# Patient Record
Sex: Female | Born: 1965 | Hispanic: No | Marital: Single | Smoking: Never smoker
Health system: Southern US, Community
[De-identification: ages and names within clinical notes are randomized; demographics above are authoritative.]

## PROBLEM LIST (undated history)

## (undated) DIAGNOSIS — Z9109 Other allergy status, other than to drugs and biological substances: Secondary | ICD-10-CM

## (undated) DIAGNOSIS — D259 Leiomyoma of uterus, unspecified: Secondary | ICD-10-CM

## (undated) DIAGNOSIS — D219 Benign neoplasm of connective and other soft tissue, unspecified: Secondary | ICD-10-CM

## (undated) DIAGNOSIS — M8580 Other specified disorders of bone density and structure, unspecified site: Secondary | ICD-10-CM

## (undated) DIAGNOSIS — E785 Hyperlipidemia, unspecified: Secondary | ICD-10-CM

## (undated) DIAGNOSIS — T7840XA Allergy, unspecified, initial encounter: Secondary | ICD-10-CM

## (undated) HISTORY — PX: OTHER SURGICAL HISTORY: SHX169

## (undated) HISTORY — DX: Other allergy status, other than to drugs and biological substances: Z91.09

## (undated) HISTORY — PX: MYOMECTOMY: SHX85

## (undated) HISTORY — DX: Allergy, unspecified, initial encounter: T78.40XA

## (undated) HISTORY — DX: Benign neoplasm of connective and other soft tissue, unspecified: D21.9

## (undated) HISTORY — DX: Leiomyoma of uterus, unspecified: D25.9

## (undated) HISTORY — DX: Hyperlipidemia, unspecified: E78.5

## (undated) HISTORY — PX: APPENDECTOMY: SHX54

## (undated) HISTORY — DX: Other specified disorders of bone density and structure, unspecified site: M85.80

---

## 2005-04-23 HISTORY — PX: HYSTEROSCOPY: SHX211

## 2006-07-25 ENCOUNTER — Emergency Department (HOSPITAL_COMMUNITY): Admission: EM | Admit: 2006-07-25 | Discharge: 2006-07-26 | Payer: Self-pay | Admitting: Emergency Medicine

## 2006-10-16 ENCOUNTER — Other Ambulatory Visit: Admission: RE | Admit: 2006-10-16 | Discharge: 2006-10-16 | Payer: Self-pay | Admitting: Obstetrics and Gynecology

## 2007-06-03 ENCOUNTER — Encounter: Admission: RE | Admit: 2007-06-03 | Discharge: 2007-06-03 | Payer: Self-pay | Admitting: Internal Medicine

## 2007-07-06 ENCOUNTER — Emergency Department (HOSPITAL_COMMUNITY): Admission: EM | Admit: 2007-07-06 | Discharge: 2007-07-07 | Payer: Self-pay | Admitting: Emergency Medicine

## 2008-03-10 ENCOUNTER — Encounter: Payer: Self-pay | Admitting: Obstetrics and Gynecology

## 2008-03-10 ENCOUNTER — Ambulatory Visit: Payer: Self-pay | Admitting: Obstetrics and Gynecology

## 2008-03-10 ENCOUNTER — Other Ambulatory Visit: Admission: RE | Admit: 2008-03-10 | Discharge: 2008-03-10 | Payer: Self-pay | Admitting: Obstetrics and Gynecology

## 2008-10-22 ENCOUNTER — Ambulatory Visit: Payer: Self-pay | Admitting: Obstetrics and Gynecology

## 2008-10-27 IMAGING — CT CT ABDOMEN W/ CM
3 of 5 series · 14 of 32 positions shown, 19 images · IV contrast (OMNI 300/WATER & 100 ML OMNI 300)
Comparison: none

CLINICAL DATA: Right-sided abdominal pain.
ABDOMEN CT WITH CONTRAST:
TECHNIQUE: Multidetector CT imaging of the abdomen was performed following the standard protocol during bolus administration of intravenous contrast.
Contrast:  922cc Omnipaque 300.  Oral contrast was also given.
TECHNIQUE: Multidetector CT imaging of the pelvis was performed following the standard protocol during bolus administration of intravenous contrast.

[Series 2: routine abdomen · axial · 0.66mm/px · z∈[-376,-131]mm · 4 of 83 slices shown, 9 images]
[im 17/83  soft-tissue]
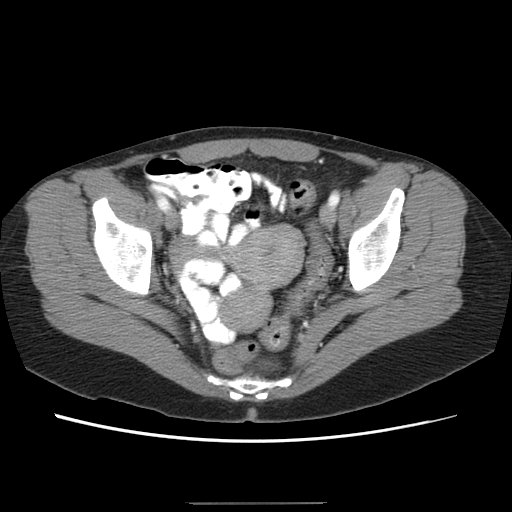
[im 17/83  lung]
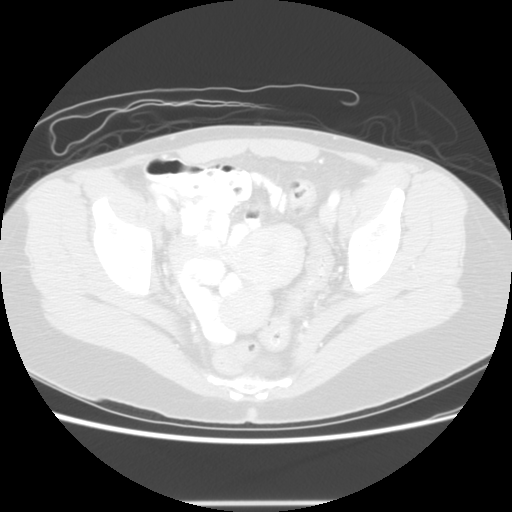
[im 17/83  bone]
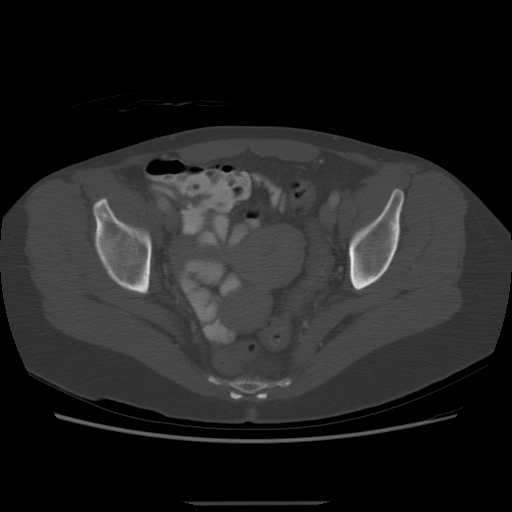
[im 33/83  soft-tissue]
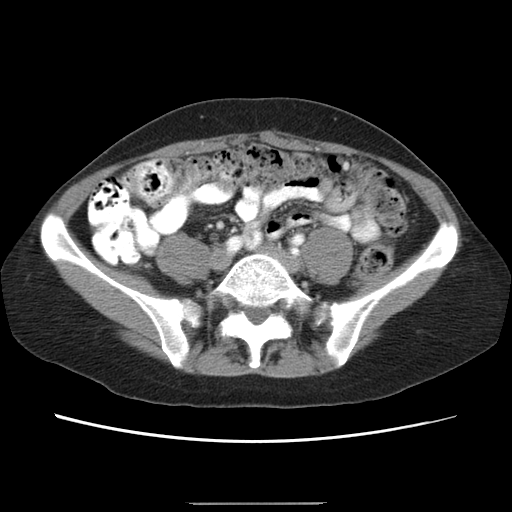
[im 33/83  lung]
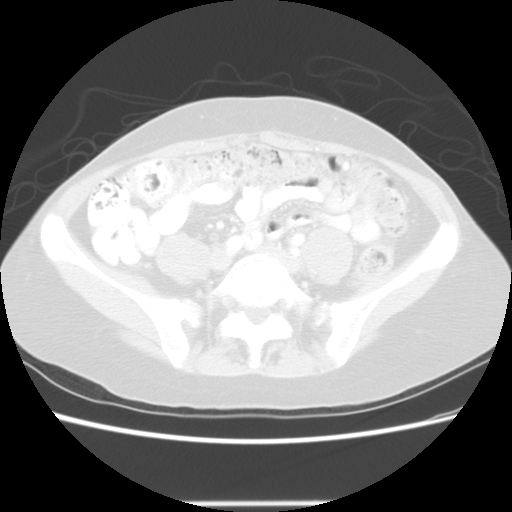
[im 50/83  soft-tissue]
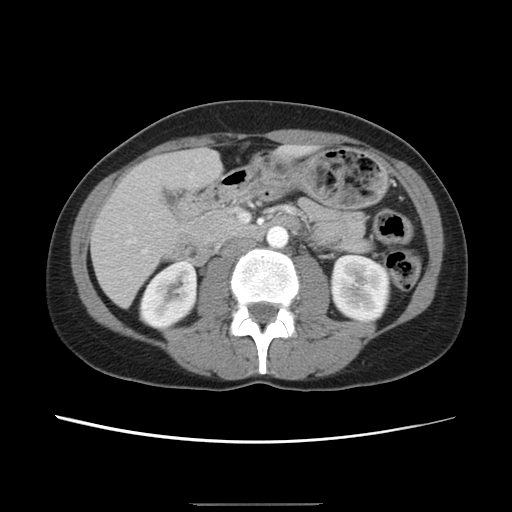
[im 50/83  lung]
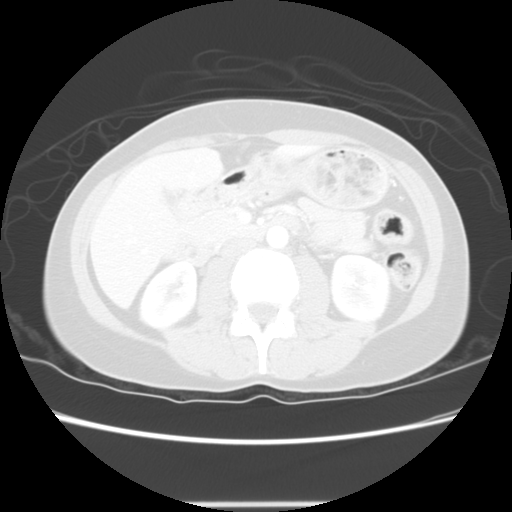
[im 66/83  soft-tissue]
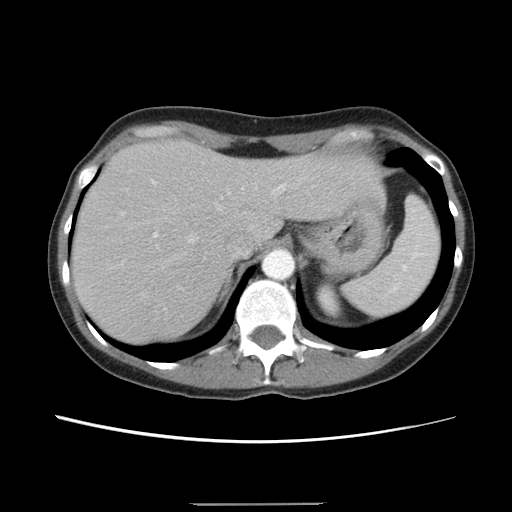
[im 66/83  lung]
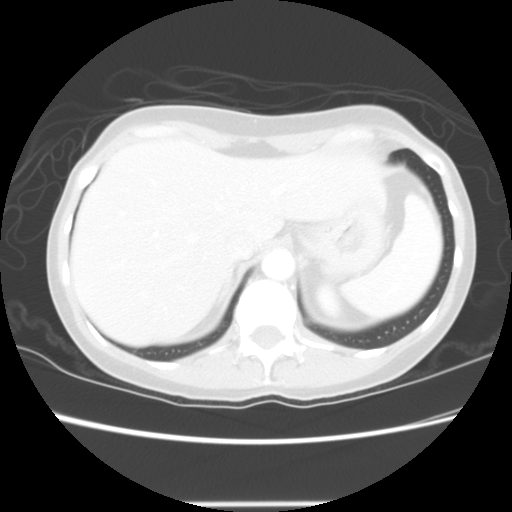

[Series 400: reformatted · sagittal · 0.87mm/px · 8 of 158 slices shown (1 of 2)]
[im 15/158  soft-tissue]
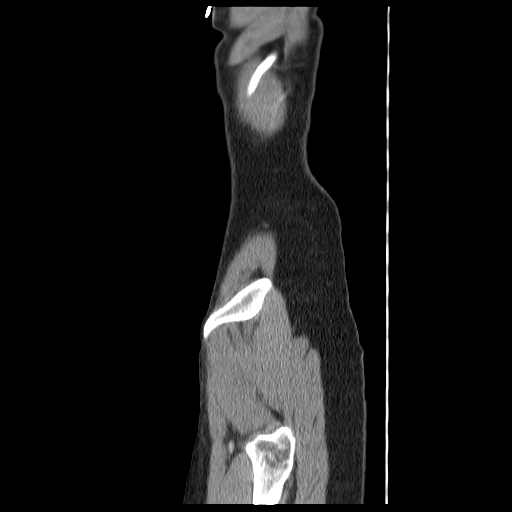
[im 29/158  soft-tissue]
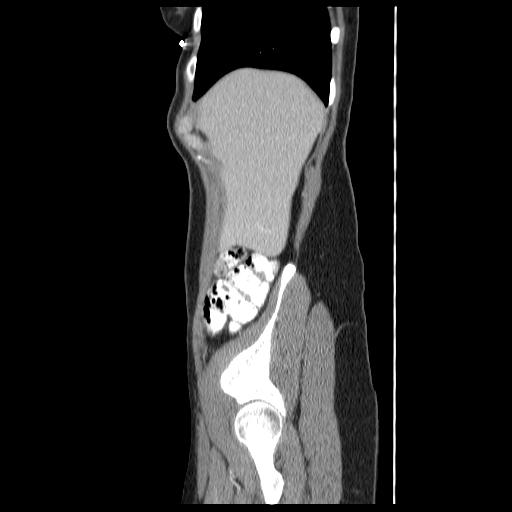
[im 58/158  soft-tissue]
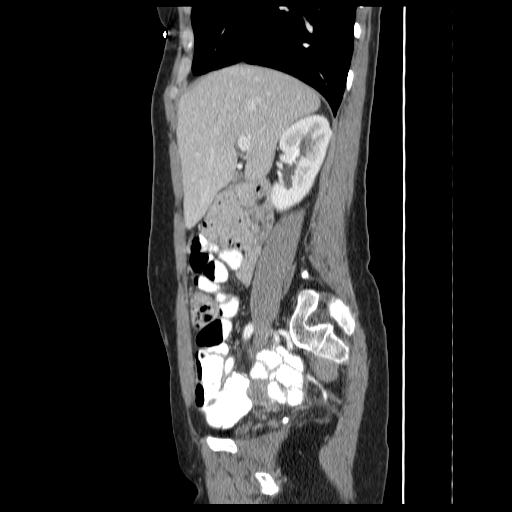
[im 72/158  soft-tissue]
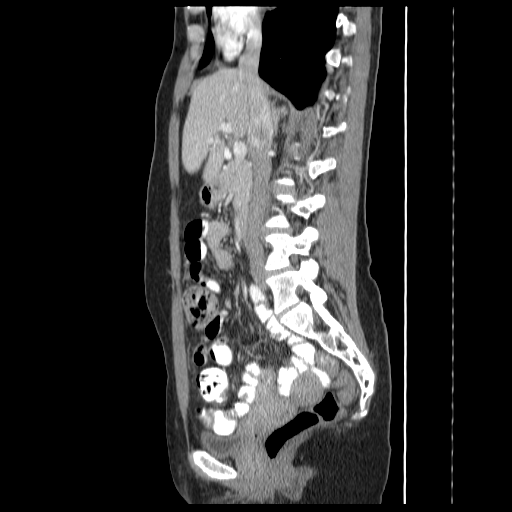
[im 86/158  soft-tissue]
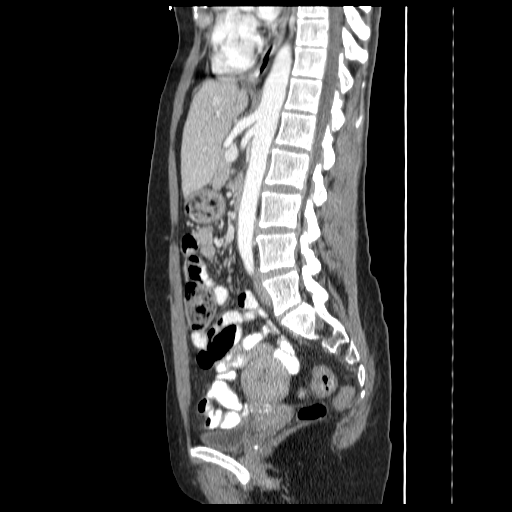
[im 100/158  soft-tissue]
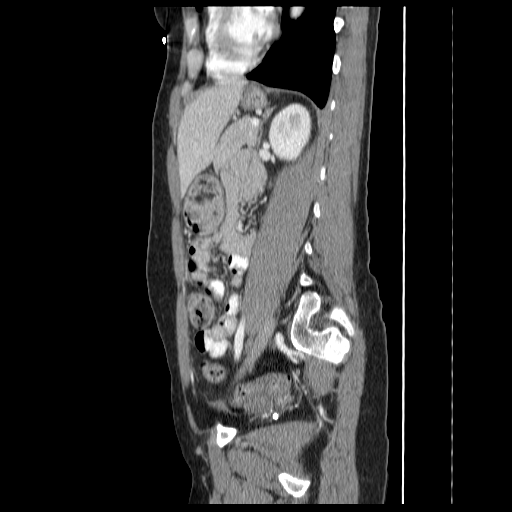
[im 129/158  soft-tissue]
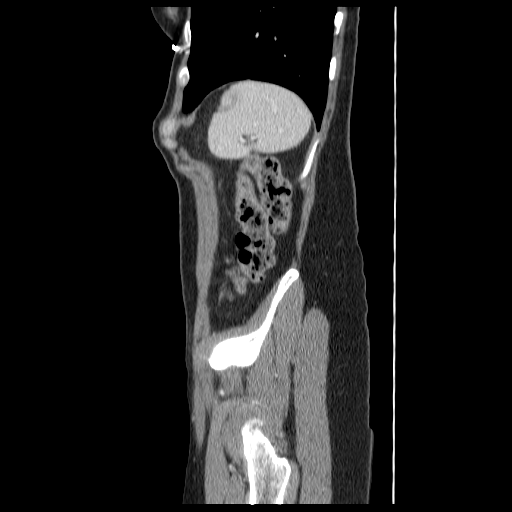
[im 143/158  soft-tissue]
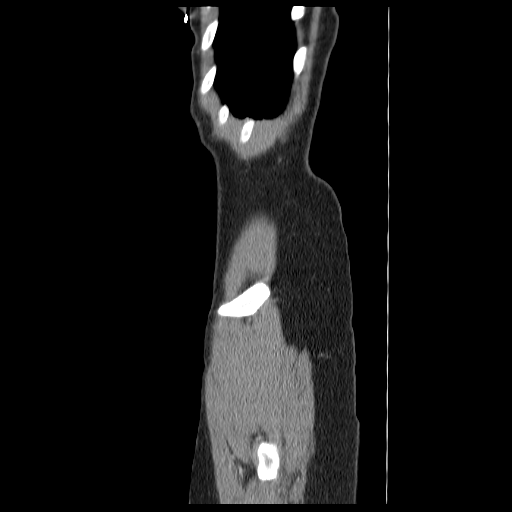

[Series 401: reformatted · coronal · 0.87mm/px · 2 of 113 slices shown (2 of 2)]
[im 15/113  soft-tissue]
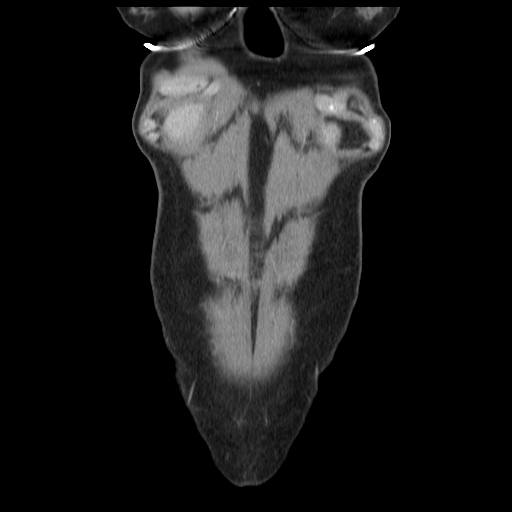
[im 29/113  soft-tissue]
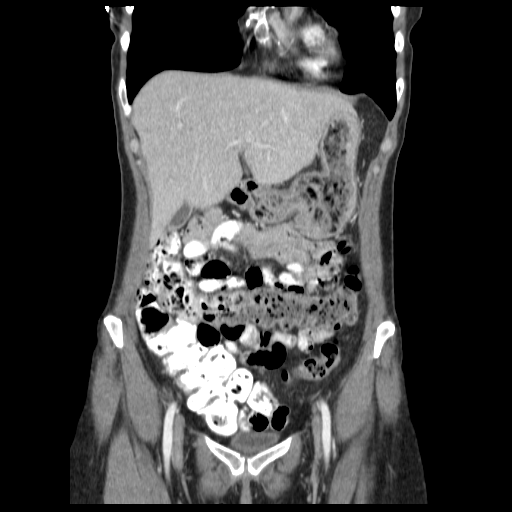

[14 of 32 positions shown; findings below may reference images not displayed]

FINDINGS: The lung bases are clear.   No pleural or pericardial fluid. 
The liver is normal in attenuation and morphology.   
The gallbladder is negative.
The spleen is negative.  
The pancreas is negative.  The adrenal glands are negative.  Left kidney and right kidney are both normal.   There is no evidence for obstructive uropathy. 
No free fluid or abnormal fluid collections.  
There are no masses within the upper abdomen.   
While the appendix cannot be identified with certainty as a separate entity, there are no secondary signs of acute appendicitis.  
Review of bone windows is unremarkable.
IMPRESSION: No acute upper abdominal CT findings.
PELVIS CT WITH CONTRAST:
FINDINGS: There is a soft tissue density mass arising from the posterior wall of the uterus measuring 3.7 cm.  This likely represents an ovarian fibroid. 
The pelvic bowel loops are otherwise unremarkable. 
No free fluid or abnormal fluid collections.  
The urinary bladder is negative.
Pelvic bowel loops are unremarkable.  
Review of bone windows is negative.
IMPRESSION: 1.  No acute pelvic CT findings. 
2.  Fibroid uterus.

## 2008-11-25 ENCOUNTER — Ambulatory Visit: Payer: Self-pay | Admitting: Obstetrics and Gynecology

## 2009-04-14 ENCOUNTER — Ambulatory Visit: Payer: Self-pay | Admitting: Obstetrics and Gynecology

## 2009-04-14 ENCOUNTER — Other Ambulatory Visit: Admission: RE | Admit: 2009-04-14 | Discharge: 2009-04-14 | Payer: Self-pay | Admitting: Obstetrics and Gynecology

## 2009-05-06 ENCOUNTER — Ambulatory Visit: Payer: Self-pay | Admitting: Obstetrics and Gynecology

## 2009-05-12 ENCOUNTER — Ambulatory Visit: Payer: Self-pay | Admitting: Obstetrics and Gynecology

## 2010-04-25 ENCOUNTER — Ambulatory Visit: Admit: 2010-04-25 | Payer: Self-pay | Admitting: Obstetrics and Gynecology

## 2010-05-02 ENCOUNTER — Ambulatory Visit
Admission: RE | Admit: 2010-05-02 | Discharge: 2010-05-02 | Payer: Self-pay | Source: Home / Self Care | Attending: Obstetrics and Gynecology | Admitting: Obstetrics and Gynecology

## 2010-05-02 ENCOUNTER — Other Ambulatory Visit
Admission: RE | Admit: 2010-05-02 | Discharge: 2010-05-02 | Payer: Self-pay | Source: Home / Self Care | Admitting: Obstetrics and Gynecology

## 2010-05-14 ENCOUNTER — Encounter: Payer: Self-pay | Admitting: Internal Medicine

## 2010-11-29 ENCOUNTER — Emergency Department (HOSPITAL_BASED_OUTPATIENT_CLINIC_OR_DEPARTMENT_OTHER)
Admission: EM | Admit: 2010-11-29 | Discharge: 2010-11-29 | Disposition: A | Discharge status: Home / Self Care | Payer: PRIVATE HEALTH INSURANCE | Attending: Emergency Medicine | Admitting: Emergency Medicine

## 2010-11-29 ENCOUNTER — Other Ambulatory Visit: Payer: Self-pay

## 2010-11-29 ENCOUNTER — Emergency Department (INDEPENDENT_AMBULATORY_CARE_PROVIDER_SITE_OTHER): Payer: PRIVATE HEALTH INSURANCE

## 2010-11-29 DIAGNOSIS — R079 Chest pain, unspecified: Secondary | ICD-10-CM

## 2010-11-29 DIAGNOSIS — R799 Abnormal finding of blood chemistry, unspecified: Secondary | ICD-10-CM

## 2010-11-29 DIAGNOSIS — R0602 Shortness of breath: Secondary | ICD-10-CM | POA: Insufficient documentation

## 2010-11-29 LAB — BASIC METABOLIC PANEL
Chloride: 99 mEq/L (ref 96–112)
GFR calc Af Amer: >60 mL/min (ref >60)
GFR calc non Af Amer: >60 mL/min (ref >60)
Potassium: 4.2 mEq/L (ref 3.5–5.1)
Sodium: 137 mEq/L (ref 135–145)

## 2010-11-29 LAB — CARDIAC PANEL(CRET KIN+CKTOT+MB+TROPI)
Relative Index: INVALID (ref 0.0–2.5)
Total CK: 41 U/L (ref 7–177)
Troponin I: <0.3 ng/mL (ref <0.30)

## 2010-11-29 LAB — CBC
MCHC: 34.3 g/dL (ref 30.0–36.0)
Platelets: 344 10*3/uL (ref 150–400)
RDW: 12.6 % (ref 11.5–15.5)
WBC: 7.1 10*3/uL (ref 4.0–10.5)

## 2010-11-29 MED ORDER — IBUPROFEN 800 MG PO TABS: 800.0000 mg | ORAL_TABLET | Freq: Three times a day (TID) | ORAL | Status: AC | PRN

## 2010-11-29 MED ORDER — MORPHINE SULFATE 2 MG/ML IJ SOLN
2.0000 mg | Freq: Once | INTRAMUSCULAR | Status: AC
  Administered 2010-11-29: 2 mg via INTRAVENOUS
  Filled 2010-11-29: qty 1

## 2010-11-29 MED ORDER — ASPIRIN 325 MG PO TABS
325.0000 mg | ORAL_TABLET | ORAL | Status: AC
  Administered 2010-11-29: 325 mg via ORAL
  Filled 2010-11-29: qty 1

## 2010-11-29 MED ORDER — IOHEXOL 350 MG/ML SOLN
80.0000 mL | Freq: Once | INTRAVENOUS | Status: AC | PRN
  Administered 2010-11-29: 80 mL via INTRAVENOUS

## 2010-11-29 MED ORDER — IBUPROFEN 800 MG PO TABS
800.0000 mg | ORAL_TABLET | Freq: Once | ORAL | Status: AC
  Administered 2010-11-29: 800 mg via ORAL
  Filled 2010-11-29: qty 1

## 2010-11-29 NOTE — ED Notes (Signed)
Pt returned from radiology.

## 2010-11-29 NOTE — ED Notes (Signed)
Pt returned from radiology.  New order received for CT scan.  Pt and family informed of plan of care.  Urine collected as ordered.

## 2010-11-29 NOTE — ED Notes (Signed)
Pt ambulatory to BR.  Awaiting results.

## 2010-11-29 NOTE — ED Notes (Signed)
Pt transported to radiology.

## 2010-11-29 NOTE — ED Provider Notes (Signed)
History     CSN: [id number] Arrival date & time: 11/29/2010  8:12 AM  Chief Complaint  Patient presents with  . Chest Pain   Patient is a 45 y.o. female presenting with chest pain.  Chest Pain The chest pain began 3 - 5 hours ago. Duration of episode(s) is 4 hours. Chest pain occurs constantly. The chest pain is improving. The pain is associated with breathing, coughing, exertion and lifting. At its most intense, the pain is at 8/10. The pain is currently at 2/10. The quality of the pain is described as aching and sharp. The pain radiates to the left shoulder. Chest pain is worsened by certain positions. She tried NSAIDs for the symptoms.  Her family medical history is significant for CAD in family.     No past medical history on file.  Past Surgical History  Procedure Date  . Appendectomy     No family history on file.  History  Substance Use Topics  . Smoking status: Never Smoker   . Smokeless tobacco: Not on file  . Alcohol Use: Yes     occasional    OB History    Grav Para Term Preterm Abortions TAB SAB Ect Mult Living                  Review of Systems  Constitutional: Negative.   HENT: Negative.   Eyes: Negative.   Respiratory: Negative.   Cardiovascular: Positive for chest pain.  Gastrointestinal: Negative.   Genitourinary: Negative.   Musculoskeletal: Negative.   Neurological: Negative.   Hematological: Negative.   Psychiatric/Behavioral: Negative.   All other systems reviewed and are negative.    Physical Exam  BP 94/65  Pulse 67  Temp(Src) 98.1 F (36.7 C) (Oral)  Resp 16  Ht 5\' 6"  (1.676 m)  Wt 130 lb (58.968 kg)  BMI 20.98 kg/m2  SpO2 100%  LMP 11/19/2010  Physical Exam  Nursing note and vitals reviewed. Constitutional: She is oriented to person, place, and time. She appears well-developed and well-nourished. No distress.  HENT:  Head: Normocephalic and atraumatic.  Eyes: Conjunctivae are normal. Pupils are equal, round, and reactive  to light.  Neck: Normal range of motion.  Cardiovascular: Normal rate, regular rhythm, normal heart sounds and intact distal pulses.  Exam reveals no gallop and no friction rub.   No murmur heard. Pulmonary/Chest: Effort normal and breath sounds normal. No respiratory distress. She has no wheezes. She has no rales.  Abdominal: Soft. Bowel sounds are normal. She exhibits no distension. There is no tenderness. There is no rebound and no guarding.  Musculoskeletal: Normal range of motion. She exhibits no edema and no tenderness.  Neurological: She is alert and oriented to person, place, and time. No cranial nerve deficit. She exhibits normal muscle tone. Coordination normal.  Skin: Skin is warm and dry.  Psychiatric: Her mood appears anxious.    ED Course  Procedures  Date: 11/29/2010  Rate: 80  Rhythm: normal sinus rhythm  QRS Axis: normal  Intervals: normal  ST/T Wave abnormalities: normal  Conduction Disutrbances:right bundle branch block  Narrative Interpretation:   Old EKG Reviewed: none available  MDM Patient was examined by myself with her parents at the bedside.  Patient was quite anxious though she had no major risk factors for CAD or PE.  Lab work-up was unremarkable aside from elevated TNI.  ECG showed RBBB and CXR was clear with possible artifact from nipple shadow.  Patient has CT angio with no PE  and no findings related to finding noted on CXR.  Patient was treated for her pain and had improvement.  She was told she could follow-up with her PCP. Patient was discharged home in good condition and family was comfortable with the plan.  A: 45 yo F who presents with chest pain.  P: As per plan in MDM      Cyndra Numbers, MD 12/05/10 1547

## 2010-11-29 NOTE — ED Notes (Signed)
Family at bedside. 

## 2010-11-29 NOTE — ED Notes (Signed)
MD at bedside. 

## 2010-11-29 NOTE — ED Notes (Signed)
Pt medicated as ordered, labs collected and pt informed of plan of care.  VSS.  NSR per cardiac monitor.

## 2010-11-29 NOTE — ED Notes (Signed)
Pt reports onset of left chest wall pain radiating to left arm and shoulder upon awakening at 0700 this morning.  Pain is described as dull pressure and a 6/10 at present time.  Pain increases with movement 8/10.  States pain is an "ache" at present.

## 2011-01-15 LAB — DIFFERENTIAL
Basophils Absolute: 0
Basophils Relative: 1
Eosinophils Absolute: 0.3
Eosinophils Relative: 4
Lymphs Abs: 2.2
Neutrophils Relative %: 62

## 2011-01-15 LAB — URINALYSIS, ROUTINE W REFLEX MICROSCOPIC
Bilirubin Urine: NEGATIVE
Hgb urine dipstick: NEGATIVE
Ketones, ur: NEGATIVE
Nitrite: NEGATIVE
Protein, ur: NEGATIVE
Urobilinogen, UA: 0.2

## 2011-01-15 LAB — COMPREHENSIVE METABOLIC PANEL
ALT: 21
AST: 19
CO2: 26
Calcium: 8.7
Chloride: 97
Creatinine, Ser: 0.68
GFR calc Af Amer: >60
GFR calc non Af Amer: >60
Glucose, Bld: 101 — ABNORMAL HIGH
Sodium: 130 — ABNORMAL LOW
Total Bilirubin: 0.5

## 2011-01-15 LAB — CBC
Hemoglobin: 13.4
MCHC: 34.1
MCV: 94.7
RBC: 4.14
WBC: 8.8

## 2011-01-15 LAB — POCT PREGNANCY, URINE: Operator id: 272551

## 2011-01-15 LAB — SEDIMENTATION RATE: Sed Rate: 20

## 2011-01-15 LAB — LIPASE, BLOOD: Lipase: 43

## 2011-05-01 ENCOUNTER — Encounter: Payer: Self-pay | Admitting: Obstetrics and Gynecology

## 2011-05-04 ENCOUNTER — Encounter: Payer: Self-pay | Admitting: Gynecology

## 2011-05-11 ENCOUNTER — Other Ambulatory Visit (HOSPITAL_COMMUNITY)
Admission: RE | Admit: 2011-05-11 | Discharge: 2011-05-11 | Disposition: A | Discharge status: Home / Self Care | Payer: PRIVATE HEALTH INSURANCE | Source: Ambulatory Visit | Attending: Obstetrics and Gynecology | Admitting: Obstetrics and Gynecology

## 2011-05-11 ENCOUNTER — Ambulatory Visit (INDEPENDENT_AMBULATORY_CARE_PROVIDER_SITE_OTHER): Payer: PRIVATE HEALTH INSURANCE | Admitting: Obstetrics and Gynecology

## 2011-05-11 ENCOUNTER — Encounter: Payer: Self-pay | Admitting: Obstetrics and Gynecology

## 2011-05-11 VITALS — BP 120/64 | Ht 65.0 in | Wt 137.0 lb

## 2011-05-11 DIAGNOSIS — R609 Edema, unspecified: Secondary | ICD-10-CM

## 2011-05-11 DIAGNOSIS — Z01419 Encounter for gynecological examination (general) (routine) without abnormal findings: Secondary | ICD-10-CM | POA: Insufficient documentation

## 2011-05-11 DIAGNOSIS — N899 Noninflammatory disorder of vagina, unspecified: Secondary | ICD-10-CM

## 2011-05-11 DIAGNOSIS — D259 Leiomyoma of uterus, unspecified: Secondary | ICD-10-CM | POA: Insufficient documentation

## 2011-05-11 DIAGNOSIS — Z9109 Other allergy status, other than to drugs and biological substances: Secondary | ICD-10-CM | POA: Insufficient documentation

## 2011-05-11 DIAGNOSIS — N898 Other specified noninflammatory disorders of vagina: Secondary | ICD-10-CM

## 2011-05-11 DIAGNOSIS — D219 Benign neoplasm of connective and other soft tissue, unspecified: Secondary | ICD-10-CM | POA: Insufficient documentation

## 2011-05-11 DIAGNOSIS — E8779 Other fluid overload: Secondary | ICD-10-CM

## 2011-05-11 LAB — LIPID PANEL
Cholesterol: 248 mg/dL — ABNORMAL HIGH (ref 0–200)
Total CHOL/HDL Ratio: 4.7 Ratio
Triglycerides: 246 mg/dL — ABNORMAL HIGH (ref <150)

## 2011-05-11 LAB — URINALYSIS W MICROSCOPIC + REFLEX CULTURE
Bilirubin Urine: NEGATIVE
Glucose, UA: NEGATIVE mg/dL
Hgb urine dipstick: NEGATIVE
Ketones, ur: NEGATIVE mg/dL
Nitrite: NEGATIVE
Specific Gravity, Urine: 1.02 (ref 1.005–1.030)
pH: 6 (ref 5.0–8.0)

## 2011-05-11 LAB — BASIC METABOLIC PANEL
BUN: 12 mg/dL (ref 6–23)
Chloride: 99 mEq/L (ref 96–112)
Creat: 0.77 mg/dL (ref 0.50–1.10)
Glucose, Bld: 77 mg/dL (ref 70–99)
Potassium: 4.4 mEq/L (ref 3.5–5.3)

## 2011-05-11 MED ORDER — TERCONAZOLE 0.8 % VA CREA: 1.0000 | TOPICAL_CREAM | Freq: Every day | VAGINAL | Status: DC

## 2011-05-11 NOTE — Progress Notes (Signed)
Patient came to see me today for her annual GYN exam. She remains on birth control pills. She says she is having 2 episodes a month of PMS type symptoms with abdominal bloating, etc. The first is after a week of the pills and the second is in the third week of the pills. She is up-to-date on mammograms. She is only having one period a month. She also feels like she did when she had a submucous myoma in terms of feeling pregnant. She was told them by the gynecologist that her fibroid was the size size with three-month pregnancy. It was removed hysteroscopically. She now wonders if this recurred. She is also been on antibiotic and now has vaginal irritation and thinks she has a yeast infection. She is having no pelvic pain. She takes spironolactone for acne and her dermatologist wanted her electrolytes checked.  Physical examination: Kennon Portela present. HEENT within normal limits. Neck: Thyroid not large. No masses. Supraclavicular nodes: not enlarged. Breasts: Examined in both sitting midline position. No skin changes and no masses. Abdomen: Soft no guarding rebound or masses or hernia. Pelvic: External: Within normal limits. BUS: Within normal limits. Vaginal:within normal limits except discharge that looks like a yeast infection. Wet prep was negative. Good estrogen effect. No evidence of cystocele rectocele or enterocele. Cervix: clean. Uterus: Normal size and shape. Adnexa: No masses. Rectovaginal exam: Confirmatory and negative. Extremities: Within normal limits.  Assessment: #1. PMS on birth control pills #2. Yeast vaginitis  Plan: Switched her birth control pills to lo Loestrin. She will let me know if her symptoms improve. 2 sample packs given. I told her I didn't think her symptoms of weight gain in her lower abdomen and bloating were related to a fibroid tumor due to a normal pelvic exam today. Explained that the only way to know if it reoccurred( submucous fibroid) was to do an SIH. We will  wait to do this.  Bmet obtained. Patient treated with terconazole for yeast vaginitis.

## 2011-05-12 LAB — CBC WITH DIFFERENTIAL/PLATELET
Basophils Relative: 1 % (ref 0–1)
Eosinophils Absolute: 0.3 10*3/uL (ref 0.0–0.7)
Lymphs Abs: 1.5 10*3/uL (ref 0.7–4.0)
MCH: 31.5 pg (ref 26.0–34.0)
Neutro Abs: 3.7 10*3/uL (ref 1.7–7.7)
Neutrophils Relative %: 64 % (ref 43–77)
Platelets: 426 10*3/uL — ABNORMAL HIGH (ref 150–400)
RBC: 4.47 MIL/uL (ref 3.87–5.11)

## 2011-05-14 LAB — WET PREP FOR TRICH, YEAST, CLUE
Clue Cells Wet Prep HPF POC: NONE SEEN
Trich, Wet Prep: NONE SEEN
WBC, Wet Prep HPF POC: NONE SEEN

## 2011-05-15 ENCOUNTER — Other Ambulatory Visit: Payer: Self-pay | Admitting: *Deleted

## 2011-05-15 DIAGNOSIS — E7800 Pure hypercholesterolemia, unspecified: Secondary | ICD-10-CM

## 2011-06-01 ENCOUNTER — Other Ambulatory Visit: Payer: Self-pay | Admitting: Obstetrics and Gynecology

## 2011-07-03 ENCOUNTER — Telehealth: Payer: Self-pay | Admitting: *Deleted

## 2011-07-03 MED ORDER — NORETHIN ACE-ETH ESTRAD-FE 1-20 MG-MCG PO TABS: 1.0000 | ORAL_TABLET | Freq: Every day | ORAL | Status: DC

## 2011-07-03 NOTE — Telephone Encounter (Signed)
Pt called requesting rx for her lo loestin birth control pills. Samples given at last OV. rx sent to [facility]

## 2012-05-28 ENCOUNTER — Ambulatory Visit (INDEPENDENT_AMBULATORY_CARE_PROVIDER_SITE_OTHER): Payer: PRIVATE HEALTH INSURANCE | Admitting: Gynecology

## 2012-05-28 ENCOUNTER — Encounter: Payer: Self-pay | Admitting: Gynecology

## 2012-05-28 DIAGNOSIS — R140 Abdominal distension (gaseous): Secondary | ICD-10-CM

## 2012-05-28 DIAGNOSIS — Z3041 Encounter for surveillance of contraceptive pills: Secondary | ICD-10-CM

## 2012-05-28 DIAGNOSIS — R141 Gas pain: Secondary | ICD-10-CM

## 2012-05-28 DIAGNOSIS — D259 Leiomyoma of uterus, unspecified: Secondary | ICD-10-CM

## 2012-05-28 MED ORDER — NORETHIN ACE-ETH ESTRAD-FE 1-20 MG-MCG PO TABS: 1.0000 | ORAL_TABLET | Freq: Every day | ORAL | Status: DC

## 2012-05-28 NOTE — Patient Instructions (Signed)
Follow up in April for your annual exam.

## 2012-05-28 NOTE — Progress Notes (Signed)
Patient presents to discuss her birth control pills. She is a patient Dr. Eda Paschal is on Loestrin 120. She apparently was started on birth control pills following a hysteroscopic myomectomy to improve her "hormonal environment" in relationship to her history of myomas. This was through a physician in [place]. She was having some issues with her prior pill and Dr. Eda Paschal switched her to the Loestrin 120. She's overall doing well but does note some bloating on and off throughout the month. No overt GI symptoms such as diarrhea constipation nausea vomiting or urinary symptoms. She plans scheduling an appointment to see me for annual exam but due to insurance purposes would prefer just to discuss her current situation as to my recommendations as far as continuing on the birth control pills. I offered to perform an exam today but she declined and she understands that our discussion is based on generalities and not based on my exam. I reviewed with her that she is not sexually active and the issues of taking the birth control pills risk versus benefits. I reviewed the risks of oral contraceptives to include stroke heart attack DVT, possibly higher with advancing age. She is not being followed for any medical issues and does not smoke. She does feel better on the pills overall and states that she would prefer to continue him. I discussed her bloating symptoms whether this is more GI or not. I asked her to follow as far as associated symptoms when she feels bloated is she having nausea constipation or diarrhea. If so then we may refer her to gastroenterology. I reviewed with her whether being on the pills has a benefit from a suppressive effect on leiomyoma versus stimulate growth and I did not feel that either is an issue sacral comfortable with her being on the pills with her history of leiomyoma.  She asked if she could have a short-term refill on her birth control pills to cover her until her appointment and again  the issue of not examining her and disclaimer I cannot guarantee is not an underlying disease process understood.

## 2012-08-04 ENCOUNTER — Encounter: Payer: Self-pay | Admitting: Gynecology

## 2012-08-28 ENCOUNTER — Other Ambulatory Visit: Payer: Self-pay | Admitting: Gynecology

## 2012-08-28 MED ORDER — NORETHIN ACE-ETH ESTRAD-FE 1-20 MG-MCG PO TABS: 1.0000 | ORAL_TABLET | Freq: Every day | ORAL | Status: DC

## 2012-09-19 ENCOUNTER — Encounter: Payer: Self-pay | Admitting: Gynecology

## 2012-09-19 ENCOUNTER — Ambulatory Visit (INDEPENDENT_AMBULATORY_CARE_PROVIDER_SITE_OTHER): Payer: PRIVATE HEALTH INSURANCE | Admitting: Gynecology

## 2012-09-19 VITALS — BP 116/78 | Ht 65.0 in | Wt 125.0 lb

## 2012-09-19 DIAGNOSIS — D259 Leiomyoma of uterus, unspecified: Secondary | ICD-10-CM

## 2012-09-19 DIAGNOSIS — N926 Irregular menstruation, unspecified: Secondary | ICD-10-CM

## 2012-09-19 DIAGNOSIS — N898 Other specified noninflammatory disorders of vagina: Secondary | ICD-10-CM

## 2012-09-19 DIAGNOSIS — Z01419 Encounter for gynecological examination (general) (routine) without abnormal findings: Secondary | ICD-10-CM

## 2012-09-19 LAB — WET PREP FOR TRICH, YEAST, CLUE
Trich, Wet Prep: NONE SEEN
Yeast Wet Prep HPF POC: NONE SEEN

## 2012-09-19 MED ORDER — NORETHIN ACE-ETH ESTRAD-FE 1-20 MG-MCG PO TABS: 1.0000 | ORAL_TABLET | Freq: Every day | ORAL | Status: DC

## 2012-09-19 MED ORDER — METRONIDAZOLE 500 MG PO TABS: 500.0000 mg | ORAL_TABLET | Freq: Two times a day (BID) | ORAL | Status: DC

## 2012-09-19 NOTE — Progress Notes (Signed)
Erica [name] 05-Mar-1966 [id number]        47 y.o.  G0P0 for annual exam.  Several issues noted below.  Past medical history,surgical history, medications, allergies, family history and social history were all reviewed and documented in the EPIC chart. ROS:  Was performed and pertinent positives and negatives are included in the history.  Exam: Charity fundraiser Vitals:   09/19/12 1429  BP: 116/78  Height: 5\' 5"  (1.651 m)  Weight: 125 lb (56.7 kg)   General appearance  Normal Skin grossly normal Head/Neck normal with no cervical or supraclavicular adenopathy thyroid normal Lungs  clear Cardiac RR, without RMG Abdominal  soft, nontender, without masses, organomegaly or hernia Breasts  examined lying and sitting without masses, retractions, discharge or axillary adenopathy. Pelvic  Ext/BUS/vagina  normal with white discharge  Cervix  normal   Uterus  retroverted, normal size, shape and contour, midline and mobile nontender   Adnexa  Without masses or tenderness    Anus and perineum  normal   Rectovaginal  normal sphincter tone without palpated masses or tenderness.    Assessment/Plan:  47 y.o. G0P0 female for annual exam.   1. BCPs. Patient is on Loestrin 120 equivalents. Notes that she now skips menses since starting this. No other symptoms such as bleeding in between or menopausal symptoms during her pill free week. I reassured the patient this is not unusual with low dose oral contraceptives. She is not sexually active. Patient's comfortable with skipping menses and will continue with the Loestrin 120 equivalents. I again reviewed the risks of oral contraceptives to include advancing age, increased risk of stroke heart attack DVT. Patient understands and accepts and I refilled her x1 year. 2. History of leiomyoma status post submucous myomectomy. Was told that she had another small myoma. I recommended baseline ultrasound now as her uterus is difficult to palpate retroverted.  We discussed we will plan no intervention but I do want to get a baseline measurement for future reference. Patient agrees with this and we'll schedule the ultrasound. 3. Mammography 2013. Patient knows she is due now and agrees to schedule her mammogram. SBE monthly reviewed. 4. Pap smear 2013. No Pap smear done today. No history of significant abnormal Pap smears. Reviewed current screening guidelines and will plan every 3 year screening. 5. Health maintenance. Patient has an appointment to see her primary physician and will followup with them. No blood work done as it will be done through their office. Followup for ultrasound otherwise annually.    Dara Lords MD, 3:38 PM 09/19/2012

## 2012-09-19 NOTE — Patient Instructions (Addendum)
Follow up for ultrasound as scheduled 

## 2012-09-19 NOTE — Progress Notes (Signed)
Not getting period with the pill

## 2012-09-20 LAB — URINALYSIS W MICROSCOPIC + REFLEX CULTURE
Bilirubin Urine: NEGATIVE
Glucose, UA: NEGATIVE mg/dL
Hgb urine dipstick: NEGATIVE
Ketones, ur: NEGATIVE mg/dL
Protein, ur: NEGATIVE mg/dL

## 2012-09-22 ENCOUNTER — Other Ambulatory Visit: Payer: Self-pay | Admitting: Gynecology

## 2012-09-22 MED ORDER — NITROFURANTOIN MONOHYD MACRO 100 MG PO CAPS: 100.0000 mg | ORAL_CAPSULE | Freq: Two times a day (BID) | ORAL | Status: DC

## 2012-09-23 LAB — URINE CULTURE

## 2013-03-06 ENCOUNTER — Other Ambulatory Visit: Payer: PRIVATE HEALTH INSURANCE

## 2013-03-06 ENCOUNTER — Ambulatory Visit: Payer: PRIVATE HEALTH INSURANCE | Admitting: Gynecology

## 2013-03-25 DIAGNOSIS — M545 Low back pain, unspecified: Secondary | ICD-10-CM | POA: Insufficient documentation

## 2013-08-07 ENCOUNTER — Encounter: Payer: Self-pay | Admitting: Women's Health

## 2013-08-07 ENCOUNTER — Ambulatory Visit (INDEPENDENT_AMBULATORY_CARE_PROVIDER_SITE_OTHER): Payer: BC Managed Care – PPO | Admitting: Women's Health

## 2013-08-07 DIAGNOSIS — R350 Frequency of micturition: Secondary | ICD-10-CM

## 2013-08-07 DIAGNOSIS — N898 Other specified noninflammatory disorders of vagina: Secondary | ICD-10-CM

## 2013-08-07 LAB — URINALYSIS W MICROSCOPIC + REFLEX CULTURE
Bilirubin Urine: NEGATIVE
Glucose, UA: NEGATIVE mg/dL
Hgb urine dipstick: NEGATIVE
Ketones, ur: NEGATIVE mg/dL
Leukocytes, UA: NEGATIVE
Nitrite: NEGATIVE
Protein, ur: NEGATIVE mg/dL
Specific Gravity, Urine: 1.015 (ref 1.005–1.030)
Urobilinogen, UA: 0.2 mg/dL (ref 0.0–1.0)
pH: 6.5 (ref 5.0–8.0)

## 2013-08-07 NOTE — Patient Instructions (Signed)
Folliculitis  Folliculitis is redness, soreness, and swelling (inflammation) of the hair follicles. This condition can occur anywhere on the body. People with weakened immune systems, diabetes, or obesity have a greater risk of getting folliculitis. CAUSES  Bacterial infection. This is the most common cause.  Fungal infection.  Viral infection.  Contact with certain chemicals, especially oils and tars. Long-term folliculitis can result from bacteria that live in the nostrils. The bacteria may trigger multiple outbreaks of folliculitis over time. SYMPTOMS Folliculitis most commonly occurs on the scalp, thighs, legs, back, buttocks, and areas where hair is shaved frequently. An early sign of folliculitis is a small, white or yellow, pus-filled, itchy lesion (pustule). These lesions appear on a red, inflamed follicle. They are usually less than 0.2 inches (5 mm) wide. When there is an infection of the follicle that goes deeper, it becomes a boil or furuncle. A group of closely packed boils creates a larger lesion (carbuncle). Carbuncles tend to occur in hairy, sweaty areas of the body. DIAGNOSIS  Your caregiver can usually tell what is wrong by doing a physical exam. A sample may be taken from one of the lesions and tested in a lab. This can help determine what is causing your folliculitis. TREATMENT  Treatment may include:  Applying warm compresses to the affected areas.  Taking antibiotic medicines orally or applying them to the skin.  Draining the lesions if they contain a large amount of pus or fluid.  Laser hair removal for cases of long-lasting folliculitis. This helps to prevent regrowth of the hair. HOME CARE INSTRUCTIONS  Apply warm compresses to the affected areas as directed by your caregiver.  If antibiotics are prescribed, take them as directed. Finish them even if you start to feel better.  You may take over-the-counter medicines to relieve itching.  Do not shave  irritated skin.  Follow up with your caregiver as directed. SEEK IMMEDIATE MEDICAL CARE IF:   You have increasing redness, swelling, or pain in the affected area.  You have a fever. MAKE SURE YOU:  Understand these instructions.  Will watch your condition.  Will get help right away if you are not doing well or get worse. Document Released: 06/18/2001 Document Revised: 10/09/2011 Document Reviewed: 07/10/2011 Exit[employer] Patient Information 2014 [employer].  

## 2013-08-07 NOTE — Progress Notes (Signed)
Patient ID: Erica [name], female   DOB: [birth year], 48 y.o.   MRN: [id number] Presents with complaint of painful bump on perineum for one week. Has had this in the past. Denies urinary frequency, urgency or burning, fever, itch, discharge. Monthly cycle on Loestrin for fibroids. Not sexually active.   Exam: Appears well, external genitalia 1 cm infected hair follicle R side perineum. With slight pressure, exuded small amount white drainage.   Folliculitis  Plan: Reassured, instructed to soak in warm bath then apply triple antibiotic ointment every day. Discussed techniques for prevention of folliculitis. Will call if not resolved in one week.

## 2013-09-15 ENCOUNTER — Encounter: Payer: Self-pay | Admitting: Gynecology

## 2013-09-21 ENCOUNTER — Other Ambulatory Visit: Payer: Self-pay | Admitting: Emergency Medicine

## 2013-09-21 ENCOUNTER — Other Ambulatory Visit: Payer: Self-pay | Admitting: Gynecology

## 2013-09-21 MED ORDER — LEVOCETIRIZINE DIHYDROCHLORIDE 5 MG PO TABS: 5.0000 mg | ORAL_TABLET | Freq: Every evening | ORAL | Status: DC

## 2013-09-23 ENCOUNTER — Ambulatory Visit (INDEPENDENT_AMBULATORY_CARE_PROVIDER_SITE_OTHER): Payer: BC Managed Care – PPO | Admitting: Gynecology

## 2013-09-23 ENCOUNTER — Encounter: Payer: Self-pay | Admitting: Gynecology

## 2013-09-23 VITALS — BP 112/66 | Ht 66.0 in | Wt 130.0 lb

## 2013-09-23 DIAGNOSIS — D251 Intramural leiomyoma of uterus: Secondary | ICD-10-CM

## 2013-09-23 DIAGNOSIS — Z01419 Encounter for gynecological examination (general) (routine) without abnormal findings: Secondary | ICD-10-CM

## 2013-09-23 MED ORDER — NORETHIN ACE-ETH ESTRAD-FE 1-20 MG-MCG PO TABS: ORAL_TABLET | ORAL | Status: DC

## 2013-09-23 NOTE — Progress Notes (Signed)
Imagine Nest 1966/02/22 [id number]        48 y.o.  G0P0 for annual exam.  Several issues noted below.  Past medical history,surgical history, problem list, medications, allergies, family history and social history were all reviewed and documented as reviewed in the EPIC chart.  ROS:  12 system ROS performed with pertinent positives and negatives included in the history, assessment and plan.  Included Systems: General, HEENT, Neck, Cardiovascular, Pulmonary, Gastrointestinal, Genitourinary, Musculoskeletal, Dermatologic, Endocrine, Hematological, Neurologic, Psychiatric Additional significant findings :  None   Exam: Kim assistant Filed Vitals:   09/23/13 1159  BP: 112/66  Height: 5\' 6"  (1.676 m)  Weight: 130 lb (58.968 kg)   General appearance:  Normal affect, orientation and appearance. Skin: Grossly normal HEENT: Without gross lesions.  No cervical or supraclavicular adenopathy. Thyroid normal.  Lungs:  Clear without wheezing, rales or rhonchi Cardiac: RR, without RMG Abdominal:  Soft, nontender, without masses, guarding, rebound, organomegaly or hernia Breasts:  Examined lying and sitting without masses, retractions, discharge or axillary adenopathy. Pelvic:  Ext/BUS/vagina normal  Cervix normal  Uterus anteverted, normal size, shape and contour, midline and mobile nontender   Adnexa  Without masses or tenderness    Anus and perineum  Normal   Rectovaginal  Normal sphincter tone without palpated masses or tenderness.    Assessment/Plan:  48 y.o. G0P0 female for annual exam with regular menses, oral contraceptives.   1. Oral contraceptives. Patient continues on a Loestrin 120 equivalent BCP.  Having regular light menses. Not sexually active. Initially started on pills to improve her hormonal environment. Risk benefits of continuing on the birth control pills reviewed to include the risk of thrombosis such as stroke heart attack DVT. Is not using them for contraception. Options  to stop now and keep menstrual calendar versus continuing reviewed. Patient struggling with this decision and ultimately decided to continue for another year. Is not having hot flashes during her pill free week. Refill x1 year provided. 2. Mammography 08/2013. Continue with annual mammography. SBE monthly review. 3. Pap smear 2013. No Pap smear done today. Plan repeat Pap smear next year a 3 year interval. No history of abnormal Pap smears previously. 4. Health maintenance. No blood work done as patient reports having routine blood work done at her primary physician's office. Followup one year, sooner as needed.   Note: This document was prepared with digital dictation and possible smart phrase technology. Any transcriptional errors that result from this process are unintentional.   Anastasio Auerbach MD, 12:38 PM 09/23/2013

## 2013-09-23 NOTE — Patient Instructions (Signed)
Follow up in one year, sooner as needed. 

## 2013-09-24 ENCOUNTER — Other Ambulatory Visit: Payer: Self-pay | Admitting: *Deleted

## 2013-09-24 DIAGNOSIS — R319 Hematuria, unspecified: Secondary | ICD-10-CM

## 2013-09-24 LAB — URINALYSIS W MICROSCOPIC + REFLEX CULTURE
BACTERIA UA: NONE SEEN
Bilirubin Urine: NEGATIVE
CRYSTALS: NONE SEEN
Casts: NONE SEEN
Glucose, UA: NEGATIVE mg/dL
Hgb urine dipstick: NEGATIVE
KETONES UR: NEGATIVE mg/dL
Leukocytes, UA: NEGATIVE
Nitrite: NEGATIVE
PROTEIN: NEGATIVE mg/dL
Specific Gravity, Urine: 1.012 (ref 1.005–1.030)
Squamous Epithelial / LPF: NONE SEEN
UROBILINOGEN UA: 0.2 mg/dL (ref 0.0–1.0)
pH: 6.5 (ref 5.0–8.0)

## 2013-09-26 LAB — URINE CULTURE: Colony Count: 30000

## 2013-09-28 ENCOUNTER — Other Ambulatory Visit: Payer: Self-pay | Admitting: Gynecology

## 2013-09-28 DIAGNOSIS — R3129 Other microscopic hematuria: Secondary | ICD-10-CM

## 2013-09-28 DIAGNOSIS — N390 Urinary tract infection, site not specified: Secondary | ICD-10-CM

## 2013-09-28 MED ORDER — AMPICILLIN 500 MG PO CAPS: 500.0000 mg | ORAL_CAPSULE | Freq: Four times a day (QID) | ORAL | Status: DC

## 2013-11-24 ENCOUNTER — Encounter: Payer: Self-pay | Admitting: Gynecology

## 2013-11-24 ENCOUNTER — Ambulatory Visit (INDEPENDENT_AMBULATORY_CARE_PROVIDER_SITE_OTHER): Payer: BC Managed Care – PPO | Admitting: Gynecology

## 2013-11-24 DIAGNOSIS — N898 Other specified noninflammatory disorders of vagina: Secondary | ICD-10-CM

## 2013-11-24 DIAGNOSIS — L293 Anogenital pruritus, unspecified: Secondary | ICD-10-CM

## 2013-11-24 LAB — WET PREP FOR TRICH, YEAST, CLUE
Clue Cells Wet Prep HPF POC: NONE SEEN
Trich, Wet Prep: NONE SEEN
YEAST WET PREP: NONE SEEN

## 2013-11-24 MED ORDER — FLUCONAZOLE 150 MG PO TABS: 150.0000 mg | ORAL_TABLET | Freq: Once | ORAL | Status: DC

## 2013-11-24 NOTE — Progress Notes (Signed)
Erica [name] 09/05/1965 [id number]        48 y.o.  G0P0 presents with several days of vaginal itching. No real discharge. History of recent antibiotic use for UTI. No odor or urinary symptoms such as progressive dysuria or urgency.  Past medical history,surgical history, problem list, medications, allergies, family history and social history were all reviewed and documented in the EPIC chart.  Directed ROS with pertinent positives and negatives documented in the history of present illness/assessment and plan.  Exam: Kim assistant General appearance:  Normal Abdomen soft nontender without masses guarding or rebound Pelvic external BUS vagina with scant white discharge. Cervix normal. Uterus normal size midline mobile nontender. Adnexa without masses or tenderness.  Assessment/Plan:  48 y.o. G0P0 with history and exam consistent with early yeast infection. Wet prep was negative. We'll cover with Diflucan 150 mg x1 dose. Followup if symptoms persist, worsen or recur.   Note: This document was prepared with digital dictation and possible smart phrase technology. Any transcriptional errors that result from this process are unintentional.   Anastasio Auerbach MD, 12:05 PM 11/24/2013

## 2013-11-24 NOTE — Patient Instructions (Addendum)
Take the Diflucan pill once. Followup if you're vaginal itching continues.

## 2014-01-03 DIAGNOSIS — Z91018 Allergy to other foods: Secondary | ICD-10-CM | POA: Insufficient documentation

## 2014-06-15 ENCOUNTER — Encounter: Payer: Self-pay | Admitting: Gynecology

## 2014-06-15 ENCOUNTER — Ambulatory Visit (INDEPENDENT_AMBULATORY_CARE_PROVIDER_SITE_OTHER): Payer: BLUE CROSS/BLUE SHIELD | Admitting: Gynecology

## 2014-06-15 VITALS — BP 120/76

## 2014-06-15 DIAGNOSIS — N898 Other specified noninflammatory disorders of vagina: Secondary | ICD-10-CM

## 2014-06-15 DIAGNOSIS — N951 Menopausal and female climacteric states: Secondary | ICD-10-CM

## 2014-06-15 LAB — WET PREP FOR TRICH, YEAST, CLUE
Clue Cells Wet Prep HPF POC: NONE SEEN
TRICH WET PREP: NONE SEEN

## 2014-06-15 MED ORDER — FLUCONAZOLE 200 MG PO TABS: 200.0000 mg | ORAL_TABLET | Freq: Every day | ORAL | Status: DC

## 2014-06-15 NOTE — Progress Notes (Signed)
Erica [name] December 16, 1965 [id number]        48 y.o.  G0P0 presents having stopped her birth control pills this past month. She was having  Trouble getting to the pharmacy for several days and ultimately decided just to stop them. She is not using them for contraception. Notes since then the onset of hot flushes and night sweats.  Also notes some mild vaginal irritation. She thinks though that this is been going on for months without real significant discharge or odor. No urinary symptoms such as frequency dysuria urgency. Was treated for yeast August 2015 with Diflucan 150 mg. She doesn't think that it had totally cleared up since then.  Past medical history,surgical history, problem list, medications, allergies, family history and social history were all reviewed and documented in the EPIC chart.  Directed ROS with pertinent positives and negatives documented in the history of present illness/assessment and plan.  Exam: Kim assistant General appearance:  Normal Abdomen soft nontender without masses guarding rebound Pelvic external BUS vagina with slight white discharge. Cervix normal. Uterus normal size midline mobile nontender. Adnexa without masses or tenderness.    Assessment/Plan:  49 y.o. G0P0 with:  1. Vaginal irritation and wet prep showing yeast. Will treat with Diflucan 200 mg every other day 2 doses. Follow up if symptoms persist, worsen or recur. 2. Menopausal symptoms after stopping her oral contraceptives. She would prefer to stay off of them at this time. We discussed treatment options for her symptoms to include HRT. I discussed the issues and risks of HRT to include the WHI study with increased risk of stroke, heart attack, DVT and breast cancer. Various formulations to include transdermal, oral, transvaginal all reviewed. Patient is interested in starting. I think Activella would be a good choice being that she just came off of oral contraceptives with similar formulation and did  well with them. She understands it is not birth control and she will need to use contraception if she does choose to become sexually active. Will check baseline Peralta now before prescribing and she'll follow up for these results in a day or 2.      Anastasio Auerbach MD, 4:08 PM 06/15/2014

## 2014-06-15 NOTE — Addendum Note (Signed)
Addended by: Anastasio Auerbach on: 06/15/2014 04:13 PM   Modules accepted: Orders

## 2014-06-15 NOTE — Patient Instructions (Signed)
Take the Diflucan pill every other day for 2 doses. Follow up if your symptoms persist, worsen or recur. Follow up for hormone lab test.

## 2014-06-16 ENCOUNTER — Other Ambulatory Visit: Payer: Self-pay | Admitting: Gynecology

## 2014-06-16 LAB — FOLLICLE STIMULATING HORMONE: FSH: 69 m[IU]/mL

## 2014-06-16 MED ORDER — ESTRADIOL-NORETHINDRONE ACET 1-0.5 MG PO TABS: 1.0000 | ORAL_TABLET | Freq: Every day | ORAL | Status: DC

## 2014-08-16 ENCOUNTER — Ambulatory Visit (INDEPENDENT_AMBULATORY_CARE_PROVIDER_SITE_OTHER): Payer: BLUE CROSS/BLUE SHIELD | Admitting: Physician Assistant

## 2014-08-16 ENCOUNTER — Encounter: Payer: Self-pay | Admitting: Physician Assistant

## 2014-08-16 VITALS — BP 110/68 | HR 76 | Temp 97.7°F | Resp 16 | Ht 66.0 in | Wt 138.0 lb

## 2014-08-16 DIAGNOSIS — Z0001 Encounter for general adult medical examination with abnormal findings: Secondary | ICD-10-CM

## 2014-08-16 DIAGNOSIS — Z1211 Encounter for screening for malignant neoplasm of colon: Secondary | ICD-10-CM

## 2014-08-16 DIAGNOSIS — Z79899 Other long term (current) drug therapy: Secondary | ICD-10-CM

## 2014-08-16 DIAGNOSIS — M545 Low back pain, unspecified: Secondary | ICD-10-CM

## 2014-08-16 DIAGNOSIS — D259 Leiomyoma of uterus, unspecified: Secondary | ICD-10-CM

## 2014-08-16 DIAGNOSIS — Z9109 Other allergy status, other than to drugs and biological substances: Secondary | ICD-10-CM

## 2014-08-16 DIAGNOSIS — L709 Acne, unspecified: Secondary | ICD-10-CM | POA: Insufficient documentation

## 2014-08-16 DIAGNOSIS — R6889 Other general symptoms and signs: Secondary | ICD-10-CM

## 2014-08-16 DIAGNOSIS — E559 Vitamin D deficiency, unspecified: Secondary | ICD-10-CM | POA: Insufficient documentation

## 2014-08-16 DIAGNOSIS — Z91048 Other nonmedicinal substance allergy status: Secondary | ICD-10-CM

## 2014-08-16 DIAGNOSIS — Z780 Asymptomatic menopausal state: Secondary | ICD-10-CM

## 2014-08-16 DIAGNOSIS — E785 Hyperlipidemia, unspecified: Secondary | ICD-10-CM | POA: Insufficient documentation

## 2014-08-16 LAB — CBC WITH DIFFERENTIAL/PLATELET
BASOS ABS: 0.1 10*3/uL (ref 0.0–0.1)
Basophils Relative: 1 % (ref 0–1)
EOS ABS: 0.4 10*3/uL (ref 0.0–0.7)
EOS PCT: 5 % (ref 0–5)
HCT: 40.9 % (ref 36.0–46.0)
HEMOGLOBIN: 14.1 g/dL (ref 12.0–15.0)
Lymphocytes Relative: 29 % (ref 12–46)
Lymphs Abs: 2.4 10*3/uL (ref 0.7–4.0)
MCH: 32.3 pg (ref 26.0–34.0)
MCHC: 34.5 g/dL (ref 30.0–36.0)
MCV: 93.6 fL (ref 78.0–100.0)
MPV: 9.8 fL (ref 8.6–12.4)
Monocytes Absolute: 0.6 10*3/uL (ref 0.1–1.0)
Monocytes Relative: 7 % (ref 3–12)
NEUTROS PCT: 58 % (ref 43–77)
Neutro Abs: 4.8 10*3/uL (ref 1.7–7.7)
PLATELETS: 387 10*3/uL (ref 150–400)
RBC: 4.37 MIL/uL (ref 3.87–5.11)
RDW: 13.1 % (ref 11.5–15.5)
WBC: 8.2 10*3/uL (ref 4.0–10.5)

## 2014-08-16 MED ORDER — MELOXICAM 15 MG PO TABS: ORAL_TABLET | ORAL | Status: DC

## 2014-08-16 MED ORDER — CYCLOBENZAPRINE HCL 10 MG PO TABS: 10.0000 mg | ORAL_TABLET | Freq: Three times a day (TID) | ORAL | Status: DC | PRN

## 2014-08-16 MED ORDER — LEVOCETIRIZINE DIHYDROCHLORIDE 5 MG PO TABS: 5.0000 mg | ORAL_TABLET | Freq: Every evening | ORAL | Status: DC

## 2014-08-16 NOTE — Progress Notes (Signed)
Complete Physical  Assessment and Plan: 1. Uterine leiomyoma, unspecified location - Iron and TIBC - Ferritin - Vitamin B12  2. Environmental allergies xyzal sent in  3. Hyperlipidemia Cont red yeast rice, diet - CBC with Differential/Platelet - BASIC METABOLIC PANEL WITH GFR - Hepatic function panel - TSH - Lipid panel - Insulin, fasting - Urinalysis, Routine w reflex microscopic - Microalbumin / creatinine urine ratio  4. Vitamin D deficiency - Vit D  25 hydroxy (rtn osteoporosis monitoring)  5. Acne, unspecified acne type Send labs to Dr. Ubaldo Glassing  6. Medication management - Magnesium  7. Bilateral low back pain without sciatica ? May benefit from dry needling- no red flags.  - Ambulatory referral to Physical Therapy  8. Menopause  9. Colon cancer screening May need celiac test as well.  - Ambulatory referral to Gastroenterology  10. Encounter for general adult medical examination with abnormal findings  Discussed med's effects and SE's. Screening labs and tests as requested with regular follow-up as recommended. Over 40 minutes of exam, counseling, chart review, and critical decision making was performed this visit.   HPI  49 y.o. female  presents for a complete physical. .  Her blood pressure has been controlled at home, today their BP is BP: 110/68 mmHg She does not workout due to lower back pain.  She denies chest pain, shortness of breath, dizziness.  She is not on cholesterol medication and denies myalgias. Her cholesterol is at goal. The cholesterol last visit was:  Trigs 156, LDL 144.   Patient is not on Vitamin D supplement, Vitamin D 41. She has been on BCP but she is now in menopause, she was RX HRT from her OB/GYN but she states she had too much fluid retention and is now on Amberin and black cohosh for 1 month which is helping.  She has chronic lower back pain and has seen Dr. Nelva Bush in the past. But only when 1 time, did PT which did not help.   She  is on spirolactone for her acne and follows with Dr. Ubaldo Glassing.  Following with Dr. Starling Manns for allergies, thinks that with menopause that she may have an allergy with seafood.  She also has stopped gluten that has helped her with her allergies and thinks she may have gluten allergy/celiac, will refer GI since due for colonoscopy.  Continuing lower back pain, muscular, worse with bending. Patient denies fever, hematuria, incontinence, numbness, tingling, weakness and saddle anesthesia  Current Medications:  Current Outpatient Prescriptions on File Prior to Visit  Medication Sig Dispense Refill  . Calcium Carbonate-Vitamin D (CALCIUM + D PO) Take by mouth.    . Cholecalciferol (VITAMIN D PO) Take by mouth.    . Coenzyme Q10 (CO Q 10 PO) Take by mouth.    . Cyanocobalamin (VITAMIN B-12 PO) Take by mouth.    . levocetirizine (XYZAL) 5 MG tablet Take 1 tablet (5 mg total) by mouth every evening. 90 tablet 0  . Omega-3 Fatty Acids (OMEGA-3 EPA FISH OIL PO) Take by mouth daily.    . Red Yeast Rice Extract (RED YEAST RICE PO) Take by mouth.    . SPIRONOLACTONE PO Take by mouth.     No current facility-administered medications on file prior to visit.   Health Maintenance:   Tetanus: 2008 Pneumovax: N/A Prevnar 13: N/A Flu vaccine: 2013 Zostavax: N/A LMP: 2015, [place] 58 at OB/GYN Pap: 04/2011 MGM:08/2013  DEXA: N/A Colonoscopy: DUE EGD: N/A CT AB 2009 CT chest 2012 Last Dental  Exam: Dr. Jabier Mutton, q 6 months Last Eye Exam: [facility], glasses, q 1-2 years  Patient Care Team: Unk Pinto, MD as PCP - General (Internal Medicine) Anastasio Auerbach, MD as Consulting Physician (Gynecology) Suella Broad, MD as Consulting Physician (Physical Medicine and Rehabilitation) Rolm Bookbinder, MD as Consulting Physician (Dermatology) Ephraim Hamburger, MD as Consulting Physician (Allergy and Immunology)  Allergies: No Known Allergies Medical History:  Past Medical History  Diagnosis  Date  . Fibroid   . Environmental allergies    Surgical History:  Past Surgical History  Procedure Laterality Date  . Appendectomy    . Myomectomy    . Hysteroscopy  2007    myomectomy   Family History:  Family History  Problem Relation Age of Onset  . Cancer Mother     Bladder cancer  . Heart disease Maternal Grandmother    Social History:  History  Substance Use Topics  . Smoking status: Never Smoker   . Smokeless tobacco: Not on file  . Alcohol Use: 0.5 oz/week    1 drink(s) per week     Comment: occasional   Review of Systems: Review of Systems  Constitutional: Positive for weight loss and malaise/fatigue. Negative for fever, chills and diaphoresis.  HENT: Positive for congestion. Negative for ear discharge, ear pain, hearing loss, nosebleeds, sore throat and tinnitus.   Eyes: Negative.   Respiratory: Negative.  Negative for stridor.   Cardiovascular: Negative.   Gastrointestinal: Negative.   Genitourinary: Negative.   Musculoskeletal: Positive for myalgias and back pain. Negative for joint pain, falls and neck pain.  Skin: Negative.   Neurological: Negative.  Negative for weakness and headaches.  Psychiatric/Behavioral: Negative.     Physical Exam: Estimated body mass index is 22.28 kg/(m^2) as calculated from the following:   Height as of this encounter: 5\' 6"  (1.676 m).   Weight as of this encounter: 138 lb (62.596 kg). BP 110/68 mmHg  Pulse 76  Temp(Src) 97.7 F (36.5 C)  Resp 16  Ht 5\' 6"  (1.676 m)  Wt 138 lb (62.596 kg)  BMI 22.28 kg/m2 General Appearance: Well nourished, in no apparent distress.  Eyes: PERRLA, EOMs, conjunctiva no swelling or erythema, normal fundi and vessels.  Sinuses: No Frontal/maxillary tenderness  ENT/Mouth: Ext aud canals clear, normal light reflex with TMs without erythema, bulging. Good dentition. No erythema, swelling, or exudate on post pharynx. Tonsils not swollen or erythematous. Hearing normal.  Neck: Supple, thyroid  normal. No bruits  Respiratory: Respiratory effort normal, BS equal bilaterally without rales, rhonchi, wheezing or stridor.  Cardio: RRR without murmurs, rubs or gallops. Brisk peripheral pulses without edema.  Chest: symmetric, with normal excursions and percussion.  Breasts: defer  Abdomen: Soft, nontender, no guarding, rebound, hernias, masses, or organomegaly.  Lymphatics: Non tender without lymphadenopathy.  Genitourinary: defer Musculoskeletal: Full ROM all peripheral extremities,5/5 strength, and normal gait. Patient is able to ambulate well. Gait is  Antalgic. Straight leg raising with dorsiflexion negative bilaterally for radicular symptoms. Sensory exam in the legs are normal. Knee reflexes are normal Ankle reflexes are normal Strength is normal and symmetric in arms and legs. There is not SI tenderness to palpation.  There is paraspinal muscle spasm.  There is not midline tenderness.  ROM of spine with  limited in all spheres due to pain.  Skin: Warm, dry without rashes, lesions, ecchymosis. Neuro: Cranial nerves intact, reflexes equal bilaterally. Normal muscle tone, no cerebellar symptoms. Sensation intact.  Psych: Awake and oriented X 3, normal  affect, Insight and Judgment appropriate.   EKG: defer AORTA SCAN: defer  Vicie Mutters 2:23 PM [facility]

## 2014-08-16 NOTE — Patient Instructions (Addendum)
Use a dropper or use a cap to put olive oil,mineral oil or canola oil in the effected ear- 2-3 times a week. Let it soak for 20-30 min then you can take a shower or use a baby bulb with warm water to wash out the ear wax.  Do not use Qtips  Preventive Care for Adults A healthy lifestyle and preventive care can promote health and wellness. Preventive health guidelines for women include the following key practices.  A routine yearly physical is a good way to check with your health care provider about your health and preventive screening. It is a chance to share any concerns and updates on your health and to receive a thorough exam.  Visit your dentist for a routine exam and preventive care every 6 months. Brush your teeth twice a day and floss once a day. Good oral hygiene prevents tooth decay and gum disease.  The frequency of eye exams is based on your age, health, family medical history, use of contact lenses, and other factors. Follow your health care provider's recommendations for frequency of eye exams.  Eat a healthy diet. Foods like vegetables, fruits, whole grains, low-fat dairy products, and lean protein foods contain the nutrients you need without too many calories. Decrease your intake of foods high in solid fats, added sugars, and salt. Eat the right amount of calories for you.Get information about a proper diet from your health care provider, if necessary.  Regular physical exercise is one of the most important things you can do for your health. Most adults should get at least 150 minutes of moderate-intensity exercise (any activity that increases your heart rate and causes you to sweat) each week. In addition, most adults need muscle-strengthening exercises on 2 or more days a week.  Maintain a healthy weight. The body mass index (BMI) is a screening tool to identify possible weight problems. It provides an estimate of body fat based on height and weight. Your health care provider can  find your BMI and can help you achieve or maintain a healthy weight.For adults 20 years and older:  A BMI below 18.5 is considered underweight.  A BMI of 18.5 to 24.9 is normal.  A BMI of 25 to 29.9 is considered overweight.  A BMI of 30 and above is considered obese.  Maintain normal blood lipids and cholesterol levels by exercising and minimizing your intake of saturated fat. Eat a balanced diet with plenty of fruit and vegetables. If your lipid or cholesterol levels are high, you are over 50, or you are at high risk for heart disease, you may need your cholesterol levels checked more frequently.Ongoing high lipid and cholesterol levels should be treated with medicines if diet and exercise are not working.  If you smoke, find out from your health care provider how to quit. If you do not use tobacco, do not start.  Lung cancer screening is recommended for adults aged 55-80 years who are at high risk for developing lung cancer because of a history of smoking. A yearly low-dose CT scan of the lungs is recommended for people who have at least a 30-pack-year history of smoking and are a current smoker or have quit within the past 15 years. A pack year of smoking is smoking an average of 1 pack of cigarettes a day for 1 year (for example: 1 pack a day for 30 years or 2 packs a day for 15 years). Yearly screening should continue until the smoker has stopped smoking for   at least 15 years. Yearly screening should be stopped for people who develop a health problem that would prevent them from having lung cancer treatment.  Avoid use of street drugs. Do not share needles with anyone. Ask for help if you need support or instructions about stopping the use of drugs.  High blood pressure causes heart disease and increases the risk of stroke.  Ongoing high blood pressure should be treated with medicines if weight loss and exercise do not work.  If you are 55-79 years old, ask your health care provider if  you should take aspirin to prevent strokes.  Diabetes screening involves taking a blood sample to check your fasting blood sugar level. This should be done once every 3 years, after age 45, if you are within normal weight and without risk factors for diabetes. Testing should be considered at a younger age or be carried out more frequently if you are overweight and have at least 1 risk factor for diabetes.  Breast cancer screening is essential preventive care for women. You should practice "breast self-awareness." This means understanding the normal appearance and feel of your breasts and may include breast self-examination. Any changes detected, no matter how small, should be reported to a health care provider. Women in their 20s and 30s should have a clinical breast exam (CBE) by a health care provider as part of a regular health exam every 1 to 3 years. After age 40, women should have a CBE every year. Starting at age 40, women should consider having a mammogram (breast X-ray test) every year. Women who have a family history of breast cancer should talk to their health care provider about genetic screening. Women at a high risk of breast cancer should talk to their health care providers about having an MRI and a mammogram every year.  Breast cancer gene (BRCA)-related cancer risk assessment is recommended for women who have family members with BRCA-related cancers. BRCA-related cancers include breast, ovarian, tubal, and peritoneal cancers. Having family members with these cancers may be associated with an increased risk for harmful changes (mutations) in the breast cancer genes BRCA1 and BRCA2. Results of the assessment will determine the need for genetic counseling and BRCA1 and BRCA2 testing.  Routine pelvic exams to screen for cancer are no longer recommended for nonpregnant women who are considered low risk for cancer of the pelvic organs (ovaries, uterus, and vagina) and who do not have symptoms. Ask  your health care provider if a screening pelvic exam is right for you.  If you have had past treatment for cervical cancer or a condition that could lead to cancer, you need Pap tests and screening for cancer for at least 20 years after your treatment. If Pap tests have been discontinued, your risk factors (such as having a new sexual partner) need to be reassessed to determine if screening should be resumed. Some women have medical problems that increase the chance of getting cervical cancer. In these cases, your health care provider may recommend more frequent screening and Pap tests.    Colorectal cancer can be detected and often prevented. Most routine colorectal cancer screening begins at the age of 50 years and continues through age 75 years. However, your health care provider may recommend screening at an earlier age if you have risk factors for colon cancer. On a yearly basis, your health care provider may provide home test kits to check for hidden blood in the stool. Use of a small camera at the end   end of a tube, to directly examine the colon (sigmoidoscopy or colonoscopy), can detect the earliest forms of colorectal cancer. Talk to your health care provider about this at age 106, when routine screening begins. Direct exam of the colon should be repeated every 5-10 years through age 22 years, unless early forms of pre-cancerous polyps or small growths are found.  Osteoporosis is a disease in which the bones lose minerals and strength with aging. This can result in serious bone fractures or breaks. The risk of osteoporosis can be identified using a bone density scan. Women ages 25 years and over and women at risk for fractures or osteoporosis should discuss screening with their health care providers. Ask your health care provider whether you should take a calcium supplement or vitamin D to reduce the rate of osteoporosis.  Menopause can be associated with physical symptoms and risks. Hormone  replacement therapy is available to decrease symptoms and risks. You should talk to your health care provider about whether hormone replacement therapy is right for you.  Use sunscreen. Apply sunscreen liberally and repeatedly throughout the day. You should seek shade when your shadow is shorter than you. Protect yourself by wearing long sleeves, pants, a wide-brimmed hat, and sunglasses year round, whenever you are outdoors.  Once a month, do a whole body skin exam, using a mirror to look at the skin on your back. Tell your health care provider of new moles, moles that have irregular borders, moles that are larger than a pencil eraser, or moles that have changed in shape or color.  Stay current with required vaccines (immunizations).  Influenza vaccine. All adults should be immunized every year.  Tetanus, diphtheria, and acellular pertussis (Td, Tdap) vaccine. Pregnant women should receive 1 dose of Tdap vaccine during each pregnancy. The dose should be obtained regardless of the length of time since the last dose. Immunization is preferred during the 27th-36th week of gestation. An adult who has not previously received Tdap or who does not know her vaccine status should receive 1 dose of Tdap. This initial dose should be followed by tetanus and diphtheria toxoids (Td) booster doses every 10 years. Adults with an unknown or incomplete history of completing a 3-dose immunization series with Td-containing vaccines should begin or complete a primary immunization series including a Tdap dose. Adults should receive a Td booster every 10 years.    Zoster vaccine. One dose is recommended for adults aged 72 years or older unless certain conditions are present.    Pneumococcal 13-valent conjugate (PCV13) vaccine. When indicated, a person who is uncertain of her immunization history and has no record of immunization should receive the PCV13 vaccine. An adult aged 48 years or older who has certain medical  conditions and has not been previously immunized should receive 1 dose of PCV13 vaccine. This PCV13 should be followed with a dose of pneumococcal polysaccharide (PPSV23) vaccine. The PPSV23 vaccine dose should be obtained at least 8 weeks after the dose of PCV13 vaccine. An adult aged 12 years or older who has certain medical conditions and previously received 1 or more doses of PPSV23 vaccine should receive 1 dose of PCV13. The PCV13 vaccine dose should be obtained 1 or more years after the last PPSV23 vaccine dose.    Pneumococcal polysaccharide (PPSV23) vaccine. When PCV13 is also indicated, PCV13 should be obtained first. All adults aged 31 years and older should be immunized. An adult younger than age 12 years who has certain medical conditions should be  immunized. Any person who resides in a nursing home or long-term care facility should be immunized. An adult smoker should be immunized. People with an immunocompromised condition and certain other conditions should receive both PCV13 and PPSV23 vaccines. People with human immunodeficiency virus (HIV) infection should be immunized as soon as possible after diagnosis. Immunization during chemotherapy or radiation therapy should be avoided. Routine use of PPSV23 vaccine is not recommended for American Indians, [place] Natives, or people younger than 65 years unless there are medical conditions that require PPSV23 vaccine. When indicated, people who have unknown immunization and have no record of immunization should receive PPSV23 vaccine. One-time revaccination 5 years after the first dose of PPSV23 is recommended for people aged 19-64 years who have chronic kidney failure, nephrotic syndrome, asplenia, or immunocompromised conditions. People who received 1-2 doses of PPSV23 before age 70 years should receive another dose of PPSV23 vaccine at age 39 years or later if at least 5 years have passed since the previous dose. Doses of PPSV23 are not needed for  people immunized with PPSV23 at or after age 74 years.   Preventive Services / Frequency  Ages 54 years and over  Blood pressure check.  Lipid and cholesterol check.  Lung cancer screening. / Every year if you are aged 33-80 years and have a 30-pack-year history of smoking and currently smoke or have quit within the past 15 years. Yearly screening is stopped once you have quit smoking for at least 15 years or develop a health problem that would prevent you from having lung cancer treatment.  Clinical breast exam.** / Every year after age 2 years.  BRCA-related cancer risk assessment.** / For women who have family members with a BRCA-related cancer (breast, ovarian, tubal, or peritoneal cancers).  Mammogram.** / Every year beginning at age 13 years and continuing for as long as you are in good health. Consult with your health care provider.  Pap test.** / Every 3 years starting at age 68 years through age 16 or 46 years with 3 consecutive normal Pap tests. Testing can be stopped between 65 and 70 years with 3 consecutive normal Pap tests and no abnormal Pap or HPV tests in the past 10 years.  Fecal occult blood test (FOBT) of stool. / Every year beginning at age 33 years and continuing until age 31 years. You may not need to do this test if you get a colonoscopy every 10 years.  Flexible sigmoidoscopy or colonoscopy.** / Every 5 years for a flexible sigmoidoscopy or every 10 years for a colonoscopy beginning at age 40 years and continuing until age 30 years.  Hepatitis C blood test.** / For all people born from [birth year] through 1967 and any individual with known risks for hepatitis C.  Osteoporosis screening.** / A one-time screening for women ages 60 years and over and women at risk for fractures or osteoporosis.  Skin self-exam. / Monthly.  Influenza vaccine. / Every year.  Tetanus, diphtheria, and acellular pertussis (Tdap/Td) vaccine.** / 1 dose of Td every 10 years.  Zoster  vaccine.** / 1 dose for adults aged 75 years or older.  Pneumococcal 13-valent conjugate (PCV13) vaccine.** / Consult your health care provider.  Pneumococcal polysaccharide (PPSV23) vaccine.** / 1 dose for all adults aged 52 years and older. Screening for abdominal aortic aneurysm (AAA)  by ultrasound is recommended for people who have history of high blood pressure or who are current or former smokers.

## 2014-08-17 ENCOUNTER — Encounter: Payer: Self-pay | Admitting: Internal Medicine

## 2014-08-17 LAB — LIPID PANEL
Cholesterol: 201 mg/dL — ABNORMAL HIGH (ref 0–200)
HDL: 39 mg/dL — AB (ref >=46)
LDL Cholesterol: 90 mg/dL (ref 0–99)
Total CHOL/HDL Ratio: 5.2 Ratio
Triglycerides: 358 mg/dL — ABNORMAL HIGH (ref <150)
VLDL: 72 mg/dL — AB (ref 0–40)

## 2014-08-17 LAB — BASIC METABOLIC PANEL WITH GFR
BUN: 12 mg/dL (ref 6–23)
CO2: 29 mEq/L (ref 19–32)
CREATININE: 0.83 mg/dL (ref 0.50–1.10)
Calcium: 10 mg/dL (ref 8.4–10.5)
Chloride: 100 mEq/L (ref 96–112)
GFR, EST NON AFRICAN AMERICAN: 82 mL/min
Glucose, Bld: 82 mg/dL (ref 70–99)
Potassium: 4.6 mEq/L (ref 3.5–5.3)
Sodium: 139 mEq/L (ref 135–145)

## 2014-08-17 LAB — HEPATIC FUNCTION PANEL
ALBUMIN: 4.4 g/dL (ref 3.5–5.2)
ALT: 29 U/L (ref 0–35)
AST: 21 U/L (ref 0–37)
Alkaline Phosphatase: 71 U/L (ref 39–117)
BILIRUBIN DIRECT: 0.1 mg/dL (ref 0.0–0.3)
BILIRUBIN TOTAL: 0.4 mg/dL (ref 0.2–1.2)
Indirect Bilirubin: 0.3 mg/dL (ref 0.2–1.2)
Total Protein: 7.5 g/dL (ref 6.0–8.3)

## 2014-08-17 LAB — URINALYSIS, ROUTINE W REFLEX MICROSCOPIC
Bilirubin Urine: NEGATIVE
GLUCOSE, UA: NEGATIVE mg/dL
HGB URINE DIPSTICK: NEGATIVE
Ketones, ur: NEGATIVE mg/dL
Nitrite: NEGATIVE
Protein, ur: NEGATIVE mg/dL
Specific Gravity, Urine: <1.005 — ABNORMAL LOW (ref 1.005–1.030)
UROBILINOGEN UA: 0.2 mg/dL (ref 0.0–1.0)
pH: 7 (ref 5.0–8.0)

## 2014-08-17 LAB — MICROALBUMIN / CREATININE URINE RATIO
CREATININE, URINE: 49.1 mg/dL
MICROALB UR: 0.2 mg/dL (ref <2.0)
Microalb Creat Ratio: 4.1 mg/g (ref 0.0–30.0)

## 2014-08-17 LAB — URINALYSIS, MICROSCOPIC ONLY
Casts: NONE SEEN
Crystals: NONE SEEN
Squamous Epithelial / LPF: NONE SEEN

## 2014-08-17 LAB — INSULIN, FASTING: INSULIN FASTING, SERUM: 5.1 u[IU]/mL (ref 2.0–19.6)

## 2014-08-17 LAB — FERRITIN: FERRITIN: 91 ng/mL (ref 10–291)

## 2014-08-17 LAB — VITAMIN B12: VITAMIN B 12: 1760 pg/mL — AB (ref 211–911)

## 2014-08-17 LAB — IRON AND TIBC
%SAT: 34 % (ref 20–55)
IRON: 132 ug/dL (ref 42–145)
TIBC: 392 ug/dL (ref 250–470)
UIBC: 260 ug/dL (ref 125–400)

## 2014-08-17 LAB — TSH: TSH: 0.759 u[IU]/mL (ref 0.350–4.500)

## 2014-08-17 LAB — MAGNESIUM: MAGNESIUM: 1.9 mg/dL (ref 1.5–2.5)

## 2014-08-17 LAB — VITAMIN D 25 HYDROXY (VIT D DEFICIENCY, FRACTURES): VIT D 25 HYDROXY: 44 ng/mL (ref 30–100)

## 2014-09-09 ENCOUNTER — Ambulatory Visit (INDEPENDENT_AMBULATORY_CARE_PROVIDER_SITE_OTHER): Payer: BLUE CROSS/BLUE SHIELD | Admitting: Physician Assistant

## 2014-09-09 ENCOUNTER — Encounter: Payer: Self-pay | Admitting: Physician Assistant

## 2014-09-09 VITALS — BP 122/78 | HR 88 | Temp 97.7°F | Resp 16 | Ht 66.0 in | Wt 137.0 lb

## 2014-09-09 DIAGNOSIS — M545 Low back pain, unspecified: Secondary | ICD-10-CM

## 2014-09-09 DIAGNOSIS — H6121 Impacted cerumen, right ear: Secondary | ICD-10-CM

## 2014-09-09 DIAGNOSIS — E781 Pure hyperglyceridemia: Secondary | ICD-10-CM

## 2014-09-09 MED ORDER — FENOFIBRATE 145 MG PO TABS: 145.0000 mg | ORAL_TABLET | Freq: Every day | ORAL | Status: DC

## 2014-09-09 MED ORDER — IBUPROFEN-FAMOTIDINE 800-26.6 MG PO TABS: ORAL_TABLET | ORAL | Status: DC

## 2014-09-09 NOTE — Progress Notes (Signed)
Assessment and Plan: Hypertriglyceridemia fenofibrate daily, recheck 4-6 weeks LFTs, Chol Cerumen impaction- stop using Qtips, irrigation used in the office without complications, use OTC drops/oil at home to prevent reoccurence LBP- will try duexis.    HPI 49 y.o.female presents for 1 month follow up from CPE for back pain, cerumen impaction. She was also very concerned about her trigs, she has a strong family history of elevated trigs and MI early age (68's and 55's). She has gluten allergy so never eats bread and very rarely eats sugar. She has lower back pain, her mom and sister are on deuxis and she would like to try a sample.   Past Medical History  Diagnosis Date  . Fibroid   . Environmental allergies      Allergies  Allergen Reactions  . Molds & Smuts Rash    Pt was tested at allergist.      Current Outpatient Prescriptions on File Prior to Visit  Medication Sig Dispense Refill  . Calcium Carbonate-Vitamin D (CALCIUM + D PO) Take by mouth.    . Cholecalciferol (VITAMIN D PO) Take by mouth.    . Coenzyme Q10 (CO Q 10 PO) Take by mouth.    . Cyanocobalamin (VITAMIN B-12 PO) Take by mouth.    . cyclobenzaprine (FLEXERIL) 10 MG tablet Take 1 tablet (10 mg total) by mouth every 8 (eight) hours as needed for muscle spasms. 60 tablet 1  . EPINEPHrine 0.3 mg/0.3 mL IJ SOAJ injection Inject 0.3 mg into the muscle.    . levocetirizine (XYZAL) 5 MG tablet Take 1 tablet (5 mg total) by mouth every evening. 90 tablet 1  . meloxicam (MOBIC) 15 MG tablet Take once daily as needed daily with food for pain, do not take aleve/ibuprofen with it, can take tylenol 90 tablet 0  . Multiple Vitamin (THERA) TABS Take 1 tablet by mouth.    . Omega-3 Fatty Acids (OMEGA-3 EPA FISH OIL PO) Take by mouth daily.    . Red Yeast Rice Extract (RED YEAST RICE PO) Take by mouth.    . SPIRONOLACTONE PO Take by mouth.     No current facility-administered medications on file prior to visit.    ROS: all  negative except above.   Physical Exam: Filed Weights   09/09/14 1636  Weight: 137 lb (62.143 kg)   BP 122/78 mmHg  Pulse 88  Temp(Src) 97.7 F (36.5 C)  Resp 16  Ht 5\' 6"  (1.676 m)  Wt 137 lb (62.143 kg)  BMI 22.12 kg/m2 General Appearance: Well nourished, in no apparent distress. Eyes: PERRLA, EOMs, conjunctiva no swelling or erythema Sinuses: No Frontal/maxillary tenderness ENT/Mouth: Ext aud canals clear, TMs without erythema, bulging. No erythema, swelling, or exudate on post pharynx.  Tonsils not swollen or erythematous. Hearing normal.  Neck: Supple, thyroid normal.  Respiratory: Respiratory effort normal, BS equal bilaterally without rales, rhonchi, wheezing or stridor.  Cardio: RRR with no MRGs. Brisk peripheral pulses without edema.  Abdomen: Soft, + BS.  Non tender, no guarding, rebound, hernias, masses. Lymphatics: Non tender without lymphadenopathy.  Musculoskeletal: Full ROM, 5/5 strength, normal gait.  Skin: Warm, dry without rashes, lesions, ecchymosis.  Neuro: Cranial nerves intact. Normal muscle tone, no cerebellar symptoms. Sensation intact.  Psych: Awake and oriented X 3, normal affect, Insight and Judgment appropriate.     Vicie Mutters, PA-C 5:00 PM [facility]

## 2014-09-09 NOTE — Patient Instructions (Addendum)
Use a dropper or use a cap to put olive oil,mineral oil or canola oil in the effected ear- 2-3 times a week. Let it soak for 20-30 min then you can take a shower or use a baby bulb with warm water to wash out the ear wax.  Do not use Qtips  Stop the RYRE while starting the new medication, can restart 1-2 months, if it will interact will get muscle aches.   Stop the meloxicam and you can try the duexis.   Fenofibrate (micronized or non-micronized) tablets What is this medicine? FENOFIBRATE (fen oh FYE brate) can help lower blood fats and cholesterol for people who are at risk of getting inflammation of the pancreas (pancreatitis) from having very high amounts of fats in their blood. This medicine is only for patients whose blood fats are not controlled by diet. This medicine may be used for other purposes; ask your health care provider or pharmacist if you have questions. COMMON BRAND NAME(S): Fenoglide, Judith Blonder, Triglide What should I tell my health care provider before I take this medicine? They need to know if you have any of these conditions: -gallbladder disease -heart disease -kidney disease -liver disease -an unusual or allergic reaction to fenofibrate, gemfibrozil, other medicines, foods, dyes, or preservatives -pregnant or trying to get pregnant -breast-feeding How should I use this medicine? Take this medicine by mouth with a glass of water. Follow the directions on the prescription label. Do not take chipped or broken tablets. Take your medicine at regular intervals. Do not take it more often than directed. Do not stop taking except on your doctor's advice. Talk to your pediatrician regarding the use of this medicine in children. Special care may be needed. Overdosage: If you think you have taken too much of this medicine contact a poison control center or emergency room at once. NOTE: This medicine is only for you. Do not share this medicine with others. What if I miss a  dose? If you miss a dose, take it as soon as you can. If it is almost time for your next dose, take only that dose. Do not take double or extra doses. What may interact with this medicine? Do not take this medicine with any of the following medications: -ezetimibe -statin-type cholesterol lowering drugs like atorvastatin, cerivastatin, fluvastatin, lovastatin, pravastatin, or simvastatin -red yeast rice This medicine may also interact with the following medications: -cholestyramine or colestipol -cyclosporine -warfarin This list may not describe all possible interactions. Give your health care provider a list of all the medicines, herbs, non-prescription drugs, or dietary supplements you use. Also tell them if you smoke, drink alcohol, or use illegal drugs. Some items may interact with your medicine. What should I watch for while using this medicine? Visit your doctor or health care professional for regular checks on your progress. Your blood fats and other tests will be measured from time to time. Do not stop taking this medicine except on the advice of your doctor or health care professional. This medicine is only part of a total cholesterol-lowering program. Your health care professional or dietician can suggest a low-cholesterol and low-fat diet that will reduce your risk of getting heart and blood vessel disease. Avoid alcohol and smoking, and keep a proper exercise schedule. If you are diabetic, close regulation and monitoring of your blood sugars can help your blood fat levels. This medicine may change the way your diabetic medication works, and sometimes will require that your dosages be adjusted. Check with your  doctor or health care professional. This medicine can make you more sensitive to the sun. Keep out of the sun. If you cannot avoid being in the sun, wear protective clothing and use sunscreen. Do not use sun lamps or tanning beds/booths. What side effects may I notice from receiving  this medicine? Side effects that you should report to your doctor or health care professional as soon as possible: -allergic reactions like skin rash, itching or hives, swelling of the face, lips, or tongue -dark urine -lower back or side pain -muscle pain, tenderness, or weakness -skin-bruising -stomach pain -trouble passing urine or change in the amount of urine -unusually weak or tired -yellowing of the eyes or skin Side effects that usually do not require medical attention (report to your doctor or health care professional if they continue or are bothersome): -constipation -headache -nausea This list may not describe all possible side effects. Call your doctor for medical advice about side effects. You may report side effects to FDA at 1-800-FDA-1088. Where should I keep my medicine? Keep out of the reach of children. Store the tablets in the original container at room temperature between 15 and 30 degrees C (59 and 86 degrees F). Keep container tightly closed. Throw away any unused medicine after the expiration date. NOTE: This sheet is a summary. It may not cover all possible information. If you have questions about this medicine, talk to your doctor, pharmacist, or health care provider.  2015, Elsevier/Gold Standard. (2010-02-01 13:39:05)

## 2014-09-14 ENCOUNTER — Encounter: Payer: Self-pay | Admitting: Physician Assistant

## 2014-09-29 ENCOUNTER — Other Ambulatory Visit: Payer: Self-pay

## 2014-09-29 DIAGNOSIS — M545 Low back pain: Secondary | ICD-10-CM

## 2014-10-04 ENCOUNTER — Ambulatory Visit (AMBULATORY_SURGERY_CENTER): Payer: Self-pay

## 2014-10-04 VITALS — Ht 66.0 in | Wt 132.0 lb

## 2014-10-04 DIAGNOSIS — Z1211 Encounter for screening for malignant neoplasm of colon: Secondary | ICD-10-CM

## 2014-10-04 MED ORDER — MOVIPREP 100 G PO SOLR: 1.0000 | Freq: Once | ORAL | Status: DC

## 2014-10-04 NOTE — Progress Notes (Signed)
No allergies to eggs or soy No diet/weight loss meds No home oxygen No past problems with anesthesia (woke up during surgery/outpatient in her 20's) Did fine with fibroid surgery and appendectomy  Has email  Emmi instructions given for colonoscopy

## 2014-10-11 ENCOUNTER — Other Ambulatory Visit: Payer: BLUE CROSS/BLUE SHIELD

## 2014-10-11 DIAGNOSIS — E781 Pure hyperglyceridemia: Secondary | ICD-10-CM

## 2014-10-11 LAB — LIPID PANEL
Cholesterol: 194 mg/dL (ref 0–200)
HDL: 54 mg/dL (ref >=46)
LDL CALC: 124 mg/dL — AB (ref 0–99)
Total CHOL/HDL Ratio: 3.6 Ratio
Triglycerides: 81 mg/dL (ref <150)
VLDL: 16 mg/dL (ref 0–40)

## 2014-10-11 LAB — HEPATIC FUNCTION PANEL
ALT: 33 U/L (ref 0–35)
AST: 23 U/L (ref 0–37)
Albumin: 4.6 g/dL (ref 3.5–5.2)
Alkaline Phosphatase: 53 U/L (ref 39–117)
BILIRUBIN INDIRECT: 0.4 mg/dL (ref 0.2–1.2)
BILIRUBIN TOTAL: 0.5 mg/dL (ref 0.2–1.2)
Bilirubin, Direct: 0.1 mg/dL (ref 0.0–0.3)
Total Protein: 7.1 g/dL (ref 6.0–8.3)

## 2014-10-18 ENCOUNTER — Ambulatory Visit (AMBULATORY_SURGERY_CENTER): Payer: BLUE CROSS/BLUE SHIELD | Admitting: Internal Medicine

## 2014-10-18 ENCOUNTER — Encounter: Payer: Self-pay | Admitting: Internal Medicine

## 2014-10-18 VITALS — BP 98/66 | HR 70 | Temp 98.7°F | Resp 16 | Ht 66.0 in | Wt 132.0 lb

## 2014-10-18 DIAGNOSIS — D120 Benign neoplasm of cecum: Secondary | ICD-10-CM

## 2014-10-18 DIAGNOSIS — Z1211 Encounter for screening for malignant neoplasm of colon: Secondary | ICD-10-CM | POA: Diagnosis not present

## 2014-10-18 MED ORDER — SODIUM CHLORIDE 0.9 % IV SOLN: 500.0000 mL | INTRAVENOUS | Status: DC

## 2014-10-18 NOTE — Progress Notes (Signed)
Report to PACU, RN, vss, BBS= Clear.  

## 2014-10-18 NOTE — Progress Notes (Signed)
Called to room to assist during endoscopic procedure.  Patient ID and intended procedure confirmed with present staff. Received instructions for my participation in the procedure from the performing physician.  

## 2014-10-18 NOTE — Patient Instructions (Signed)
Discharge instructions given. Handouts on polyps and diverticulosis. Resume previous medications. YOU HAD AN ENDOSCOPIC PROCEDURE TODAY AT THE [facility]:   Refer to the procedure report that was given to you for any specific questions about what was found during the examination.  If the procedure report does not answer your questions, please call your gastroenterologist to clarify.  If you requested that your care partner not be given the details of your procedure findings, then the procedure report has been included in a sealed envelope for you to review at your convenience later.  YOU SHOULD EXPECT: Some feelings of bloating in the abdomen. Passage of more gas than usual.  Walking can help get rid of the air that was put into your GI tract during the procedure and reduce the bloating. If you had a lower endoscopy (such as a colonoscopy or flexible sigmoidoscopy) you may notice spotting of blood in your stool or on the toilet paper. If you underwent a bowel prep for your procedure, you may not have a normal bowel movement for a few days.  Please Note:  You might notice some irritation and congestion in your nose or some drainage.  This is from the oxygen used during your procedure.  There is no need for concern and it should clear up in a day or so.  SYMPTOMS TO REPORT IMMEDIATELY:   Following lower endoscopy (colonoscopy or flexible sigmoidoscopy):  Excessive amounts of blood in the stool  Significant tenderness or worsening of abdominal pains  Swelling of the abdomen that is new, acute  Fever of 100F or higher   For urgent or emergent issues, a gastroenterologist can be reached at any hour by calling [phone].   DIET: Your first meal following the procedure should be a small meal and then it is ok to progress to your normal diet. Heavy or fried foods are harder to digest and may make you feel nauseous or bloated.  Likewise, meals heavy in dairy and vegetables can  increase bloating.  Drink plenty of fluids but you should avoid alcoholic beverages for 24 hours.  ACTIVITY:  You should plan to take it easy for the rest of today and you should NOT DRIVE or use heavy machinery until tomorrow (because of the sedation medicines used during the test).    FOLLOW UP: Our staff will call the number listed on your records the next business day following your procedure to check on you and address any questions or concerns that you may have regarding the information given to you following your procedure. If we do not reach you, we will leave a message.  However, if you are feeling well and you are not experiencing any problems, there is no need to return our call.  We will assume that you have returned to your regular daily activities without incident.  If any biopsies were taken you will be contacted by phone or by letter within the next 1-3 weeks.  Please call us at [phone] if you have not heard about the biopsies in 3 weeks.    SIGNATURES/CONFIDENTIALITY: You and/or your care partner have signed paperwork which will be entered into your electronic medical record.  These signatures attest to the fact that that the information above on your After Visit Summary has been reviewed and is understood.  Full responsibility of the confidentiality of this discharge information lies with you and/or your care-partner. 

## 2014-10-18 NOTE — Op Note (Signed)
[facility]  [address] [place], [id number]   COLONOSCOPY PROCEDURE REPORT  PATIENT: Erica [name], Erica [name]  MR#: [id number] BIRTHDATE: 24-Aug-1965 , 49  yrs. old GENDER: female ENDOSCOPIST: Eustace Quail, MD REFERRED RX:[id number] Melford Aase, M.D. PROCEDURE DATE:  10/18/2014 PROCEDURE:   Colonoscopy, screening and Colonoscopy with snare polypectomy x 1 First Screening Colonoscopy - Avg.  risk and is 50 yrs.  old or older Yes.  Prior Negative Screening - Now for repeat screening. N/A  History of Adenoma - Now for follow-up colonoscopy & has been > or = to 3 yrs.  N/A  Polyps removed today? Yes ASA CLASS:   Class II INDICATIONS:Screening for colonic neoplasia and Colorectal Neoplasm Risk Assessment for this procedure is average risk. MEDICATIONS: Monitored anesthesia care and Propofol 350 mg IV  DESCRIPTION OF PROCEDURE:   After the risks benefits and alternatives of the procedure were thoroughly explained, informed consent was obtained.  The digital rectal exam revealed no abnormalities of the rectum.   The [id number]  endoscope was introduced through the anus and advanced to the cecum, which was identified by both the appendix and ileocecal valve. No adverse events experienced.   The quality of the prep was excellent. (MoviPrep was used)  The instrument was then slowly withdrawn as the colon was fully examined. Estimated blood loss is zero unless otherwise noted in this procedure report.  COLON FINDINGS: A single polyp measuring 5 mm in size was found at the cecum.  A polypectomy was performed with a cold snare.  The resection was complete, the polyp tissue was completely retrieved and sent to histology.   There was moderate diverticulosis noted in the right colon and left colon.   The examination was otherwise normal.   The examined terminal ileum appeared to be normal. Retroflexed views revealed no abnormalities. The time to cecum = 3.0 Withdrawal time =  13.8   The scope was withdrawn and the procedure completed. COMPLICATIONS: There were no immediate complications.  ENDOSCOPIC IMPRESSION: 1.   Single polyp was found at the cecum; polypectomy was performed with a cold snare 2.   Moderate diverticulosis was noted in the right colon and left colon 3.   The examination was otherwise normal 4.   The examined terminal ileum appeared to be normal  RECOMMENDATIONS: 1. Repeat colonoscopy in 5 years if polyp adenomatous; otherwise 10 years  eSigned:  Eustace Quail, MD 10/18/2014 11:35 AM   cc: The Patient and Unk Pinto, MD

## 2014-10-19 ENCOUNTER — Telehealth: Payer: Self-pay | Admitting: Emergency Medicine

## 2014-10-19 NOTE — Telephone Encounter (Signed)
  Follow up Call-  Call back number 10/18/2014  Post procedure Call Back phone  # [phone]  Permission to leave phone message Yes     Patient questions:  Do you have a fever, pain , or abdominal swelling? No. Pain Score  0 *  Have you tolerated food without any problems? yes  Have you been able to return to your normal activities? Yes.    Do you have any questions about your discharge instructions: Diet   No. Medications  No. Follow up visit  No.  Do you have questions or concerns about your Care? No.  Actions: * If pain score is 4 or above: No action needed, pain <4.

## 2014-10-21 ENCOUNTER — Encounter: Payer: Self-pay | Admitting: Internal Medicine

## 2014-11-12 ENCOUNTER — Other Ambulatory Visit: Payer: Self-pay | Admitting: Physician Assistant

## 2014-11-30 ENCOUNTER — Encounter: Payer: BLUE CROSS/BLUE SHIELD | Admitting: Gynecology

## 2014-12-21 ENCOUNTER — Other Ambulatory Visit (HOSPITAL_COMMUNITY)
Admission: RE | Admit: 2014-12-21 | Discharge: 2014-12-21 | Disposition: A | Discharge status: Home / Self Care | Payer: BLUE CROSS/BLUE SHIELD | Source: Ambulatory Visit | Attending: Gynecology | Admitting: Gynecology

## 2014-12-21 ENCOUNTER — Encounter: Payer: Self-pay | Admitting: Gynecology

## 2014-12-21 ENCOUNTER — Ambulatory Visit (INDEPENDENT_AMBULATORY_CARE_PROVIDER_SITE_OTHER): Payer: BLUE CROSS/BLUE SHIELD | Admitting: Gynecology

## 2014-12-21 VITALS — BP 110/66 | Ht 66.0 in | Wt 135.0 lb

## 2014-12-21 DIAGNOSIS — N951 Menopausal and female climacteric states: Secondary | ICD-10-CM | POA: Diagnosis not present

## 2014-12-21 DIAGNOSIS — Z01419 Encounter for gynecological examination (general) (routine) without abnormal findings: Secondary | ICD-10-CM | POA: Diagnosis not present

## 2014-12-21 DIAGNOSIS — Z1151 Encounter for screening for human papillomavirus (HPV): Secondary | ICD-10-CM | POA: Insufficient documentation

## 2014-12-21 DIAGNOSIS — Z79890 Hormone replacement therapy (postmenopausal): Secondary | ICD-10-CM

## 2014-12-21 MED ORDER — NORETHIN ACE-ETH ESTRAD-FE 1-20 MG-MCG PO TABS
1.0000 | ORAL_TABLET | Freq: Every day | ORAL | Status: DC
Start: 2014-12-21 — End: 2015-12-21

## 2014-12-21 NOTE — Progress Notes (Signed)
Meredyth Hornung [birth year] [id number]        49 y.o.  G0P0 for annual exam.  Several issues noted below.  Past medical history,surgical history, problem list, medications, allergies, family history and social history were all reviewed and documented as reviewed in the EPIC chart.  ROS:  Performed with pertinent positives and negatives included in the history, assessment and plan.   Additional significant findings :  none   Exam: Kim Counsellor Vitals:   12/21/14 1035  BP: 110/66  Height: 5\' 6"  (1.676 m)  Weight: 135 lb (61.236 kg)   General appearance:  Normal affect, orientation and appearance. Skin: Grossly normal HEENT: Without gross lesions.  No cervical or supraclavicular adenopathy. Thyroid normal.  Lungs:  Clear without wheezing, rales or rhonchi Cardiac: RR, without RMG Abdominal:  Soft, nontender, without masses, guarding, rebound, organomegaly or hernia Breasts:  Examined lying and sitting without masses, retractions, discharge or axillary adenopathy. Pelvic:  Ext/BUS/vagina normal  Cervix normal. Pap/HPV  Uterus anteverted, normal size, shape and contour, midline and mobile nontender   Adnexa  Without masses or tenderness    Anus and perineum  Normal   Rectovaginal  Normal sphincter tone without palpated masses or tenderness.    Assessment/Plan:  49 y.o. G0P0 female for annual exam without menses, not sexually active.   1. Without menses/menopausal symptoms. Had previously been on oral contraceptives, stop these and had a follow up [facility] of 67. No menses since beginning of this year when she stopped the pills. Was started on Activella equivalent but noticed a lot of bloating and stop this. Now having significant hot flashes and sleep disturbances. No vaginal bleeding. I reviewed the options to include observation, non-hormonal OTC products, HRT reinitiation and restarting the birth control pills. Risks benefits discussed to include increased risk of stroke heart attack  DVT and breast cancer as per the WHI study. Possible increased risk of birth control pills for thrombosis with advancing age. Not being followed for medical issues and never smoked. After a lengthy discussion the patient wants to restart low-dose oral contraceptives at least for the next year or 2. She is contemplating becoming sexually active and would also prefer a more assured birth control at this point.  Loestrin 1/20 equivalent 1 year prescribed. Call if any issues after initiation. 2. Pap smear 2013. Pap/HPV today.  No history of significant abnormal Pap smears previously. 3. Mammography 08/2013. I reminded her that she is overdue and she agrees to call and schedule. SBE monthly reviewed. 4. Colonoscopy 2016. Repeat at their recommended interval. 5. Health maintenance. No routine blood work done as this is done at her primary physician's office. Follow up 1 year, sooner if any issues after starting the birth control pills.   Anastasio Auerbach MD, 11:09 AM 12/21/2014

## 2014-12-21 NOTE — Patient Instructions (Signed)
Start back on the birth control pills. Call me if you have any issues with this.  You may obtain a copy of any labs that were done today by logging onto MyChart as outlined in the instructions provided with your AVS (after visit summary). The office will not call with normal lab results but certainly if there are any significant abnormalities then we will contact you.   Health Maintenance Adopting a healthy lifestyle and getting preventive care can go a long way to promote health and wellness. Talk with your health care provider about what schedule of regular examinations is right for you. This is a good chance for you to check in with your provider about disease prevention and staying healthy. In between checkups, there are plenty of things you can do on your own. Experts have done a lot of research about which lifestyle changes and preventive measures are most likely to keep you healthy. Ask your health care provider for more information. WEIGHT AND DIET  Eat a healthy diet  Be sure to include plenty of vegetables, fruits, low-fat dairy products, and lean protein.  Do not eat a lot of foods high in solid fats, added sugars, or salt.  Get regular exercise. This is one of the most important things you can do for your health.  Most adults should exercise for at least 150 minutes each week. The exercise should increase your heart rate and make you sweat (moderate-intensity exercise).  Most adults should also do strengthening exercises at least twice a week. This is in addition to the moderate-intensity exercise.  Maintain a healthy weight  Body mass index (BMI) is a measurement that can be used to identify possible weight problems. It estimates body fat based on height and weight. Your health care provider can help determine your BMI and help you achieve or maintain a healthy weight.  For females 36 years of age and older:   A BMI below 18.5 is considered underweight.  A BMI of 18.5 to  24.9 is normal.  A BMI of 25 to 29.9 is considered overweight.  A BMI of 30 and above is considered obese.  Watch levels of cholesterol and blood lipids  You should start having your blood tested for lipids and cholesterol at 51 years of age, then have this test every 5 years.  You may need to have your cholesterol levels checked more often if:  Your lipid or cholesterol levels are high.  You are older than 51 years of age.  You are at high risk for heart disease.  CANCER SCREENING   Lung Cancer  Lung cancer screening is recommended for adults 75-66 years old who are at high risk for lung cancer because of a history of smoking.  A yearly low-dose CT scan of the lungs is recommended for people who:  Currently smoke.  Have quit within the past 15 years.  Have at least a 30-pack-year history of smoking. A pack year is smoking an average of one pack of cigarettes a day for 1 year.  Yearly screening should continue until it has been 15 years since you quit.  Yearly screening should stop if you develop a health problem that would prevent you from having lung cancer treatment.  Breast Cancer  Practice breast self-awareness. This means understanding how your breasts normally appear and feel.  It also means doing regular breast self-exams. Let your health care provider know about any changes, no matter how small.  If you are in your 54s  or 65s, you should have a clinical breast exam (CBE) by a health care provider every 1-3 years as part of a regular health exam.  If you are 88 or older, have a CBE every year. Also consider having a breast X-ray (mammogram) every year.  If you have a family history of breast cancer, talk to your health care provider about genetic screening.  If you are at high risk for breast cancer, talk to your health care provider about having an MRI and a mammogram every year.  Breast cancer gene (BRCA) assessment is recommended for women who have family  members with BRCA-related cancers. BRCA-related cancers include:  Breast.  Ovarian.  Tubal.  Peritoneal cancers.  Results of the assessment will determine the need for genetic counseling and BRCA1 and BRCA2 testing. Cervical Cancer Routine pelvic examinations to screen for cervical cancer are no longer recommended for nonpregnant women who are considered low risk for cancer of the pelvic organs (ovaries, uterus, and vagina) and who do not have symptoms. A pelvic examination may be necessary if you have symptoms including those associated with pelvic infections. Ask your health care provider if a screening pelvic exam is right for you.   The Pap test is the screening test for cervical cancer for women who are considered at risk.  If you had a hysterectomy for a problem that was not cancer or a condition that could lead to cancer, then you no longer need Pap tests.  If you are older than 65 years, and you have had normal Pap tests for the past 10 years, you no longer need to have Pap tests.  If you have had past treatment for cervical cancer or a condition that could lead to cancer, you need Pap tests and screening for cancer for at least 20 years after your treatment.  If you no longer get a Pap test, assess your risk factors if they change (such as having a new sexual partner). This can affect whether you should start being screened again.  Some women have medical problems that increase their chance of getting cervical cancer. If this is the case for you, your health care provider may recommend more frequent screening and Pap tests.  The human papillomavirus (HPV) test is another test that may be used for cervical cancer screening. The HPV test looks for the virus that can cause cell changes in the cervix. The cells collected during the Pap test can be tested for HPV.  The HPV test can be used to screen women 72 years of age and older. Getting tested for HPV can extend the interval  between normal Pap tests from three to five years.  An HPV test also should be used to screen women of any age who have unclear Pap test results.  After 51 years of age, women should have HPV testing as often as Pap tests.  Colorectal Cancer  This type of cancer can be detected and often prevented.  Routine colorectal cancer screening usually begins at 51 years of age and continues through 51 years of age.  Your health care provider may recommend screening at an earlier age if you have risk factors for colon cancer.  Your health care provider may also recommend using home test kits to check for hidden blood in the stool.  A small camera at the end of a tube can be used to examine your colon directly (sigmoidoscopy or colonoscopy). This is done to check for the earliest forms of colorectal cancer.  Routine screening usually begins at age 63.  Direct examination of the colon should be repeated every 5-10 years through 51 years of age. However, you may need to be screened more often if early forms of precancerous polyps or small growths are found. Skin Cancer  Check your skin from head to toe regularly.  Tell your health care provider about any new moles or changes in moles, especially if there is a change in a mole's shape or color.  Also tell your health care provider if you have a mole that is larger than the size of a pencil eraser.  Always use sunscreen. Apply sunscreen liberally and repeatedly throughout the day.  Protect yourself by wearing long sleeves, pants, a wide-brimmed hat, and sunglasses whenever you are outside. HEART DISEASE, DIABETES, AND HIGH BLOOD PRESSURE   Have your blood pressure checked at least every 1-2 years. High blood pressure causes heart disease and increases the risk of stroke.  If you are between 19 years and 40 years old, ask your health care provider if you should take aspirin to prevent strokes.  Have regular diabetes screenings. This involves  taking a blood sample to check your fasting blood sugar level.  If you are at a normal weight and have a low risk for diabetes, have this test once every three years after 51 years of age.  If you are overweight and have a high risk for diabetes, consider being tested at a younger age or more often. PREVENTING INFECTION  Hepatitis B  If you have a higher risk for hepatitis B, you should be screened for this virus. You are considered at high risk for hepatitis B if:  You were born in a country where hepatitis B is common. Ask your health care provider which countries are considered high risk.  Your parents were born in a high-risk country, and you have not been immunized against hepatitis B (hepatitis B vaccine).  You have HIV or AIDS.  You use needles to inject street drugs.  You live with someone who has hepatitis B.  You have had sex with someone who has hepatitis B.  You get hemodialysis treatment.  You take certain medicines for conditions, including cancer, organ transplantation, and autoimmune conditions. Hepatitis C  Blood testing is recommended for:  Everyone born from [birth year] through 1967.  Anyone with known risk factors for hepatitis C. Sexually transmitted infections (STIs)  You should be screened for sexually transmitted infections (STIs) including gonorrhea and chlamydia if:  You are sexually active and are younger than 51 years of age.  You are older than 51 years of age and your health care provider tells you that you are at risk for this type of infection.  Your sexual activity has changed since you were last screened and you are at an increased risk for chlamydia or gonorrhea. Ask your health care provider if you are at risk.  If you do not have HIV, but are at risk, it may be recommended that you take a prescription medicine daily to prevent HIV infection. This is called pre-exposure prophylaxis (PrEP). You are considered at risk if:  You are sexually active  and do not regularly use condoms or know the HIV status of your partner(s).  You take drugs by injection.  You are sexually active with a partner who has HIV. Talk with your health care provider about whether you are at high risk of being infected with HIV. If you choose to begin PrEP, you should first  be tested for HIV. You should then be tested every 3 months for as long as you are taking PrEP.  PREGNANCY   If you are premenopausal and you may become pregnant, ask your health care provider about preconception counseling.  If you may become pregnant, take 400 to 800 micrograms (mcg) of folic acid every day.  If you want to prevent pregnancy, talk to your health care provider about birth control (contraception). OSTEOPOROSIS AND MENOPAUSE   Osteoporosis is a disease in which the bones lose minerals and strength with aging. This can result in serious bone fractures. Your risk for osteoporosis can be identified using a bone density scan.  If you are 6 years of age or older, or if you are at risk for osteoporosis and fractures, ask your health care provider if you should be screened.  Ask your health care provider whether you should take a calcium or vitamin D supplement to lower your risk for osteoporosis.  Menopause may have certain physical symptoms and risks.  Hormone replacement therapy may reduce some of these symptoms and risks. Talk to your health care provider about whether hormone replacement therapy is right for you.  HOME CARE INSTRUCTIONS   Schedule regular health, dental, and eye exams.  Stay current with your immunizations.   Do not use any tobacco products including cigarettes, chewing tobacco, or electronic cigarettes.  If you are pregnant, do not drink alcohol.  If you are breastfeeding, limit how much and how often you drink alcohol.  Limit alcohol intake to no more than 1 drink per day for nonpregnant women. One drink equals 12 ounces of beer, 5 ounces of wine,  or 1 ounces of hard liquor.  Do not use street drugs.  Do not share needles.  Ask your health care provider for help if you need support or information about quitting drugs.  Tell your health care provider if you often feel depressed.  Tell your health care provider if you have ever been abused or do not feel safe at home. Document Released: 10/23/2010 Document Revised: 08/24/2013 Document Reviewed: 03/11/2013 [facility] Patient Information 2015 [place], [place]. This information is not intended to replace advice given to you by your health care provider. Make sure you discuss any questions you have with your health care provider.

## 2014-12-21 NOTE — Addendum Note (Signed)
Addended by: Nelva Nay on: 12/21/2014 11:17 AM   Modules accepted: Orders

## 2014-12-22 LAB — CYTOLOGY - PAP

## 2015-01-19 ENCOUNTER — Ambulatory Visit (INDEPENDENT_AMBULATORY_CARE_PROVIDER_SITE_OTHER): Payer: BLUE CROSS/BLUE SHIELD | Admitting: *Deleted

## 2015-01-19 DIAGNOSIS — Z23 Encounter for immunization: Secondary | ICD-10-CM | POA: Diagnosis not present

## 2015-01-25 ENCOUNTER — Encounter: Payer: Self-pay | Admitting: Gynecology

## 2015-02-01 ENCOUNTER — Encounter: Payer: BLUE CROSS/BLUE SHIELD | Admitting: Gynecology

## 2015-02-12 ENCOUNTER — Other Ambulatory Visit: Payer: Self-pay | Admitting: Physician Assistant

## 2015-03-03 ENCOUNTER — Ambulatory Visit: Payer: Self-pay | Admitting: Physician Assistant

## 2015-03-07 ENCOUNTER — Encounter: Payer: Self-pay | Admitting: Physician Assistant

## 2015-03-07 ENCOUNTER — Ambulatory Visit (INDEPENDENT_AMBULATORY_CARE_PROVIDER_SITE_OTHER): Payer: BLUE CROSS/BLUE SHIELD | Admitting: Physician Assistant

## 2015-03-07 VITALS — BP 100/60 | HR 92 | Temp 97.0°F | Resp 16 | Ht 66.0 in | Wt 138.0 lb

## 2015-03-07 DIAGNOSIS — E785 Hyperlipidemia, unspecified: Secondary | ICD-10-CM | POA: Diagnosis not present

## 2015-03-07 DIAGNOSIS — E559 Vitamin D deficiency, unspecified: Secondary | ICD-10-CM

## 2015-03-07 DIAGNOSIS — Z79899 Other long term (current) drug therapy: Secondary | ICD-10-CM

## 2015-03-07 DIAGNOSIS — M545 Low back pain, unspecified: Secondary | ICD-10-CM

## 2015-03-07 LAB — CBC WITH DIFFERENTIAL/PLATELET
Basophils Absolute: 0.1 10*3/uL (ref 0.0–0.1)
Basophils Relative: 1 % (ref 0–1)
EOS PCT: 2 % (ref 0–5)
Eosinophils Absolute: 0.1 10*3/uL (ref 0.0–0.7)
HEMATOCRIT: 37.6 % (ref 36.0–46.0)
HEMOGLOBIN: 12.7 g/dL (ref 12.0–15.0)
LYMPHS PCT: 27 % (ref 12–46)
Lymphs Abs: 1.7 10*3/uL (ref 0.7–4.0)
MCH: 31.7 pg (ref 26.0–34.0)
MCHC: 33.8 g/dL (ref 30.0–36.0)
MCV: 93.8 fL (ref 78.0–100.0)
MONO ABS: 0.4 10*3/uL (ref 0.1–1.0)
MONOS PCT: 6 % (ref 3–12)
MPV: 10.2 fL (ref 8.6–12.4)
NEUTROS ABS: 4 10*3/uL (ref 1.7–7.7)
Neutrophils Relative %: 64 % (ref 43–77)
Platelets: 414 10*3/uL — ABNORMAL HIGH (ref 150–400)
RBC: 4.01 MIL/uL (ref 3.87–5.11)
RDW: 12.9 % (ref 11.5–15.5)
WBC: 6.3 10*3/uL (ref 4.0–10.5)

## 2015-03-07 LAB — BASIC METABOLIC PANEL WITH GFR
BUN: 17 mg/dL (ref 7–25)
CALCIUM: 9.6 mg/dL (ref 8.6–10.4)
CO2: 25 mmol/L (ref 20–31)
CREATININE: 0.81 mg/dL (ref 0.50–1.05)
Chloride: 104 mmol/L (ref 98–110)
GFR, Est Non African American: 84 mL/min (ref >=60)
GLUCOSE: 90 mg/dL (ref 65–99)
Potassium: 4.3 mmol/L (ref 3.5–5.3)
Sodium: 138 mmol/L (ref 135–146)

## 2015-03-07 LAB — HEPATIC FUNCTION PANEL
ALBUMIN: 4.1 g/dL (ref 3.6–5.1)
ALT: 19 U/L (ref 6–29)
AST: 16 U/L (ref 10–35)
Alkaline Phosphatase: 55 U/L (ref 33–130)
Bilirubin, Direct: 0.1 mg/dL (ref <=0.2)
Indirect Bilirubin: 0.3 mg/dL (ref 0.2–1.2)
TOTAL PROTEIN: 6.6 g/dL (ref 6.1–8.1)
Total Bilirubin: 0.4 mg/dL (ref 0.2–1.2)

## 2015-03-07 LAB — LIPID PANEL
CHOLESTEROL: 164 mg/dL (ref 125–200)
HDL: 44 mg/dL — ABNORMAL LOW (ref >=46)
LDL Cholesterol: 100 mg/dL (ref <130)
TRIGLYCERIDES: 100 mg/dL (ref <150)
Total CHOL/HDL Ratio: 3.7 Ratio (ref <=5.0)
VLDL: 20 mg/dL (ref <30)

## 2015-03-07 LAB — TSH: TSH: 1.346 u[IU]/mL (ref 0.350–4.500)

## 2015-03-07 LAB — MAGNESIUM: Magnesium: 1.9 mg/dL (ref 1.5–2.5)

## 2015-03-07 MED ORDER — METHOCARBAMOL 750 MG PO TABS: 750.0000 mg | ORAL_TABLET | Freq: Three times a day (TID) | ORAL | Status: DC | PRN

## 2015-03-07 MED ORDER — FENOFIBRATE 145 MG PO TABS: 145.0000 mg | ORAL_TABLET | Freq: Every day | ORAL | Status: DC

## 2015-03-07 MED ORDER — LEVOCETIRIZINE DIHYDROCHLORIDE 5 MG PO TABS
5.0000 mg | ORAL_TABLET | Freq: Every evening | ORAL | Status: DC
Start: 2015-03-07 — End: 2015-07-01

## 2015-03-07 NOTE — Patient Instructions (Signed)

## 2015-03-07 NOTE — Progress Notes (Signed)
Assessment and Plan:  1. Hypertension -Continue medication, monitor blood pressure at home. Continue DASH diet.  Reminder to go to the ER if any CP, SOB, nausea, dizziness, severe HA, changes vision/speech, left arm numbness and tingling and jaw pain.  2. Cholesterol -Continue diet and exercise. Check cholesterol.   3. Chronic lower back pain Robaxin given, continue PT, there is improvement.   4. Vitamin D Def - check level and continue medications.   Continue diet and meds as discussed. Further disposition pending results of labs. Over 30 minutes of exam, counseling, chart review, and critical decision making was performed Future Appointments Date Time Provider [place]  08/17/2015 2:00 PM Vicie Mutters, PA-C [facility] None     HPI 49 y.o. female  presents for 3 month follow up on hypertension, cholesterol, prediabetes, and vitamin D deficiency.   Her blood pressure has been controlled at home, today their BP is BP: 100/60 mmHg  She does not workout. She denies chest pain, shortness of breath, dizziness.  She is on cholesterol medication, is on fenofibrate. Her cholesterol is at goal. The cholesterol last visit was:   Lab Results  Component Value Date   CHOL 194 10/11/2014   HDL 54 10/11/2014   LDLCALC 124* 10/11/2014   TRIG 81 10/11/2014   CHOLHDL 3.6 10/11/2014  She has chronic lower back pain, went to PT but that did not help, then went to Dr. Lynann Bologna and started PT with their therapist, and started on methocarbamol 750mg  that has helped.  She had normal colonoscopy this year.  Patient is on Vitamin D supplement.   Lab Results  Component Value Date   VD25OH 44 08/16/2014      Current Medications:  Current Outpatient Prescriptions on File Prior to Visit  Medication Sig Dispense Refill  . Calcium Carbonate-Vitamin D (CALCIUM + D PO) Take by mouth.    . Cholecalciferol (VITAMIN D PO) Take by mouth.    . Coenzyme Q10 (CO Q 10 PO) Take by mouth.    .  Cyanocobalamin (VITAMIN B-12 PO) Take by mouth.    . EPINEPHrine 0.3 mg/0.3 mL IJ SOAJ injection Inject 0.3 mg into the muscle.    . fenofibrate (TRICOR) 145 MG tablet Take 1 tablet (145 mg total) by mouth daily. 30 tablet 11  . levocetirizine (XYZAL) 5 MG tablet Take 1 tablet (5 mg total) by mouth every evening. 90 tablet 1  . Multiple Vitamin (THERA) TABS Take 1 tablet by mouth.    . norethindrone-ethinyl estradiol (MICROGESTIN FE 1/20) 1-20 MG-MCG tablet Take 1 tablet by mouth daily. 1 Package 12  . Omega-3 Fatty Acids (OMEGA-3 EPA FISH OIL PO) Take by mouth daily.    Marland Kitchen SPIRONOLACTONE PO Take by mouth.     No current facility-administered medications on file prior to visit.   Medical History:  Past Medical History  Diagnosis Date  . Fibroid   . Environmental allergies   . Allergy   . Hyperlipidemia    Allergies:  Allergies  Allergen Reactions  . Molds & Smuts Rash    Pt was tested at allergist.   . Gluten Meal     Has been tested and received a "false negative"  . Tuna [Fish Allergy] Itching    Hives; hypotension Also anchovies     Review of Systems:  Review of Systems  Constitutional: Negative.   HENT: Negative.   Eyes: Negative.   Respiratory: Negative.   Cardiovascular: Negative.   Gastrointestinal: Positive for diarrhea (with gluten).  Genitourinary: Negative.   Musculoskeletal: Positive for back pain. Negative for myalgias, joint pain, falls and neck pain.  Skin: Negative.   Neurological: Negative.  Negative for dizziness, tingling, tremors, sensory change, speech change, focal weakness, seizures and loss of consciousness.  Endo/Heme/Allergies: Negative.   Psychiatric/Behavioral: Negative.     Family history- Review and unchanged Social history- Review and unchanged Physical Exam: BP 100/60 mmHg  Pulse 92  Temp(Src) 97 F (36.1 C) (Temporal)  Resp 16  Ht 5\' 6"  (1.676 m)  Wt 138 lb (62.596 kg)  BMI 22.28 kg/m2  SpO2 97% Wt Readings from Last 3  Encounters:  03/07/15 138 lb (62.596 kg)  12/21/14 135 lb (61.236 kg)  10/18/14 132 lb (59.875 kg)   General Appearance: Well nourished, in no apparent distress. Eyes: PERRLA, EOMs, conjunctiva no swelling or erythema Sinuses: No Frontal/maxillary tenderness ENT/Mouth: Ext aud canals clear, TMs without erythema, bulging. No erythema, swelling, or exudate on post pharynx.  Tonsils not swollen or erythematous. Hearing normal.  Neck: Supple, thyroid normal.  Respiratory: Respiratory effort normal, BS equal bilaterally without rales, rhonchi, wheezing or stridor.  Cardio: RRR with no MRGs. Brisk peripheral pulses without edema.  Abdomen: Soft, + BS,  Non tender, no guarding, rebound, hernias, masses. Lymphatics: Non tender without lymphadenopathy.  Musculoskeletal: Full ROM, 5/5 strength, Normal gait Skin: Warm, dry without rashes, lesions, ecchymosis.  Neuro: Cranial nerves intact. Normal muscle tone, no cerebellar symptoms. Psych: Awake and oriented X 3, normal affect, Insight and Judgment appropriate.    Vicie Mutters, PA-C 8:58 AM [facility]

## 2015-03-08 LAB — VITAMIN D 25 HYDROXY (VIT D DEFICIENCY, FRACTURES): VIT D 25 HYDROXY: 46 ng/mL (ref 30–100)

## 2015-03-16 ENCOUNTER — Ambulatory Visit (INDEPENDENT_AMBULATORY_CARE_PROVIDER_SITE_OTHER): Payer: BLUE CROSS/BLUE SHIELD | Admitting: Gynecology

## 2015-03-16 ENCOUNTER — Encounter: Payer: Self-pay | Admitting: Gynecology

## 2015-03-16 VITALS — BP 118/76

## 2015-03-16 DIAGNOSIS — N760 Acute vaginitis: Secondary | ICD-10-CM | POA: Diagnosis not present

## 2015-03-16 LAB — WET PREP FOR TRICH, YEAST, CLUE
Clue Cells Wet Prep HPF POC: NONE SEEN
Trich, Wet Prep: NONE SEEN

## 2015-03-16 MED ORDER — FLUCONAZOLE 200 MG PO TABS: 200.0000 mg | ORAL_TABLET | ORAL | Status: DC

## 2015-03-16 NOTE — Addendum Note (Signed)
Addended by: Nelva Nay on: 03/16/2015 03:59 PM   Modules accepted: Orders

## 2015-03-16 NOTE — Patient Instructions (Signed)
Take the Diflucan pill one day wait a day and then take the second pill.  Use the refill if you have a recurrence of your symptoms.

## 2015-03-16 NOTE — Progress Notes (Signed)
Erica [name] 1965/05/03 [id number]        49 y.o.  G0P0 With a 2 day history of vaginal itching and white discharge. No urinary symptoms such as frequency dysuria urgency or any vaginal odor.  Past medical history,surgical history, problem list, medications, allergies, family history and social history were all reviewed and documented in the EPIC chart.  Directed ROS with pertinent positives and negatives documented in the history of present illness/assessment and plan.  Exam: Kim assistant Filed Vitals:   03/16/15 1458  BP: 118/76   General appearance:  Normal Abdomen soft nontender without masses guarding rebound Pelvic external BUS vagina with white discharge. Cervix normal. Uterus grossly normal size midline mobile nontender. Adnexa without masses or tenderness.  Assessment/Plan:  49 y.o. G0P0 with history, exam and wet prep consistent with yeast vaginitis. Treat with Diflucan 200 mg every OD 2 doses. One refill provided. She has tried shorter courses in the past and it seems that she requires a second dose to eradicate the yeast. Follow up if symptoms persist, worsen or recur.    Anastasio Auerbach MD, 3:29 PM 03/16/2015

## 2015-07-01 ENCOUNTER — Encounter: Payer: Self-pay | Admitting: Physician Assistant

## 2015-07-01 ENCOUNTER — Ambulatory Visit (INDEPENDENT_AMBULATORY_CARE_PROVIDER_SITE_OTHER): Payer: BLUE CROSS/BLUE SHIELD | Admitting: Physician Assistant

## 2015-07-01 VITALS — BP 100/74 | HR 81 | Temp 97.9°F | Ht 66.0 in | Wt 130.8 lb

## 2015-07-01 DIAGNOSIS — L709 Acne, unspecified: Secondary | ICD-10-CM | POA: Diagnosis not present

## 2015-07-01 DIAGNOSIS — M545 Low back pain, unspecified: Secondary | ICD-10-CM

## 2015-07-01 MED ORDER — DEXAMETHASONE SODIUM PHOSPHATE 100 MG/10ML IJ SOLN
10.0000 mg | Freq: Once | INTRAMUSCULAR | Status: AC
  Administered 2015-07-01: 10 mg via INTRAMUSCULAR

## 2015-07-01 MED ORDER — SPIRONOLACTONE 100 MG PO TABS: 100.0000 mg | ORAL_TABLET | Freq: Every day | ORAL | Status: DC

## 2015-07-01 MED ORDER — LEVOCETIRIZINE DIHYDROCHLORIDE 5 MG PO TABS: 5.0000 mg | ORAL_TABLET | Freq: Every evening | ORAL | Status: DC

## 2015-07-01 NOTE — Progress Notes (Signed)
Subjective:    Patient ID: Erica [name], female    DOB: [birth year], 50 y.o.   MRN: [id number]  HPI She has chronic lower back pain, has followed with Dr. Lynann Bologna, has done PT that helped but yesterday she states she was lifting a heavy box into her car she felt acute pain. She has been using heat/muscle relaxer. She states that she had injections in the past at trigger point and that helped.   Blood pressure 100/74, pulse 81, temperature 97.9 F (36.6 C), temperature source Temporal, height 5\' 6"  (1.676 m), weight 130 lb 12.8 oz (59.33 kg), SpO2 99 %.  Patient Active Problem List   Diagnosis Date Noted  . Hyperlipidemia 08/16/2014  . Vitamin D deficiency 08/16/2014  . Acne 08/16/2014  . 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 LBP (low back pain) 03/25/2013  . Fibroid   . Environmental allergies     Current Outpatient Prescriptions on File Prior to Visit  Medication Sig Dispense Refill  . Calcium Carbonate-Vitamin D (CALCIUM + D PO) Take by mouth.    . Cholecalciferol (VITAMIN D PO) Take by mouth.    . Coenzyme Q10 (CO Q 10 PO) Take by mouth.    . Cyanocobalamin (VITAMIN B-12 PO) Take by mouth.    . EPINEPHrine 0.3 mg/0.3 mL IJ SOAJ injection Inject 0.3 mg into the muscle.    . fenofibrate (TRICOR) 145 MG tablet Take 1 tablet (145 mg total) by mouth daily. 90 tablet 3  . levocetirizine (XYZAL) 5 MG tablet Take 1 tablet (5 mg total) by mouth every evening. 90 tablet 1  . methocarbamol (ROBAXIN-750) 750 MG tablet Take 1 tablet (750 mg total) by mouth every 8 (eight) hours as needed for muscle spasms. 90 tablet 2  . Multiple Vitamin (THERA) TABS Take 1 tablet by mouth.    . norethindrone-ethinyl estradiol (MICROGESTIN FE 1/20) 1-20 MG-MCG tablet Take 1 tablet by mouth daily. 1 Package 12  . Omega-3 Fatty Acids (OMEGA-3 EPA FISH OIL PO) Take by mouth daily.    Marland Kitchen SPIRONOLACTONE PO Take by mouth.     No current facility-administered medications on file prior to visit.   Review of Systems   Constitutional: Negative.   HENT: Negative.   Eyes: Negative.   Respiratory: Negative.   Cardiovascular: Negative.   Gastrointestinal: Positive for diarrhea (with gluten).  Genitourinary: Negative.   Musculoskeletal: Positive for back pain. Negative for myalgias and neck pain.  Skin: Negative.   Neurological: Negative.  Negative for dizziness, tremors and seizures.  Psychiatric/Behavioral: Negative.        Objective:   Physical Exam  Constitutional: She is oriented to person, place, and time. She appears well-developed and well-nourished.  HENT:  Head: Normocephalic and atraumatic.  Eyes: Conjunctivae are normal. Pupils are equal, round, and reactive to light.  Neck: Normal range of motion. Neck supple.  Cardiovascular: Normal rate and regular rhythm.   Pulmonary/Chest: Effort normal and breath sounds normal.  Abdominal: Soft. Bowel sounds are normal. There is no tenderness.  Musculoskeletal:  Patient is able to ambulate well. Gait is  Antalgic. Straight leg raising with dorsiflexion negative bilaterally for radicular symptoms. Sensory exam in the legs are normal. Knee reflexes are normal Ankle reflexes are normal Strength is normal and symmetric in arms and legs. There is SI tenderness to palpation.  There isparaspinal muscle spasm.  There is not midline tenderness.  ROM of spine with  limited in flexion due to pain.   Lymphadenopathy:    She  has no cervical adenopathy.  Neurological: She is alert and oriented to person, place, and time. She has normal reflexes.  Skin: Skin is warm and dry. No rash noted.       Assessment & Plan:  Right SI pain, negative straight leg, no bowel/bladder problems Injection- area cleaned with alcohol, Dexamethasone 10mg  and 1 CC lidocaine injected into left SI tolerated well with immediate relief.  Mobic, RICE, and exercise given If not better with refer to orthopedics.    Future Appointments Date Time Provider [place]  08/17/2015  2:00 PM Vicie Mutters, PA-C [facility] None

## 2015-07-01 NOTE — Patient Instructions (Signed)
Do stretches, heat, muscle relaxer for pain Follow up with PT as needed  Mid-Back Strain With Rehab  A strain is an injury in which a tendon or muscle is torn. The muscles and tendons of the mid-back are vulnerable to strains. However, these muscles and tendons are very strong and require a great force to be injured. The muscles of the mid-back are responsible for stabilizing the spinal column, as well as spinal twisting (rotation). Strains are classified into three categories. Grade 1 strains cause pain, but the tendon is not lengthened. Grade 2 strains include a lengthened ligament, due to the ligament being stretched or partially ruptured. With grade 2 strains there is still function, although the function may be decreased. Grade 3 strains involve a complete tear of the tendon or muscle, and function is usually impaired. SYMPTOMS   Pain in the middle of the back.  Pain that may affect only one side, and is worse with movement.  Muscle spasms, and often swelling in the back.  Loss of strength of the back muscles.  Crackling sound (crepitation) when the muscles are touched. CAUSES  Mid-back strains occur when a force is placed on the muscles or tendons that is greater than they can handle. Common causes of injury include:  Ongoing overuse of the muscle-tendon units in the middle back, usually from incorrect body posture.  A single violent injury or force applied to the back. RISK INCREASES WITH:  Sports that involve twisting forces on the spine or a lot of bending at the waist (football, rugby, weightlifting, bowling, golf, tennis, speed skating, racquetball, swimming, running, gymnastics, diving).  Poor strength and flexibility.  Failure to warm up properly before activity.  Family history of low back pain or disk disorders.  Previous back injury or surgery (especially fusion). PREVENTION  Learn and use proper sports technique.  Warm up and stretch properly before  activity.  Allow for adequate recovery between workouts.  Maintain physical fitness:  Strength, flexibility, and endurance.  Cardiovascular fitness. PROGNOSIS  If treated properly, mid-back strains usually heal within 6 weeks. RELATED COMPLICATIONS   Frequently recurring symptoms, resulting in a chronic problem. Properly treating the problem the first time decreases frequency of recurrence.  Chronic inflammation, scarring, and partial muscle-tendon tear.  Delayed healing or resolution of symptoms, especially if activity is resumed too soon.  Prolonged disability. TREATMENT Treatment first involves the use of ice and medicine, to reduce pain and inflammation. As the pain begins to subside, you may begin strengthening and stretching exercises to improve body posture and sport technique. These exercises may be performed at home or with a therapist. Severe injuries may require referral to a therapist for further evaluation and treatment, such as ultrasound. Corticosteroid injections may be given to help reduce inflammation. Biofeedback (watching monitors of your body processes) and psychotherapy may also be prescribed. Prolonged bed rest is felt to do more harm than good. Massage may help break the muscle spasms. Sometimes, an injection of cortisone, with or without local anesthetics, may be given to help relieve the pain and spasms. MEDICATION   If pain medicine is needed, nonsteroidal anti-inflammatory medicines (aspirin and ibuprofen), or other minor pain relievers (acetaminophen), are often advised.  Do not take pain medicine for 7 days before surgery.  Prescription pain relievers may be given, if your caregiver thinks they are needed. Use only as directed and only as much as you need.  Ointments applied to the skin may be helpful.  Corticosteroid injections may be given  by your caregiver. These injections should be reserved for the most serious cases, because they may only be given a  certain number of times. HEAT AND COLD:   Cold treatment (icing) should be applied for 10 to 15 minutes every 2 to 3 hours for inflammation and pain, and immediately after activity that aggravates your symptoms. Use ice packs or an ice massage.  Heat treatment may be used before performing stretching and strengthening activities prescribed by your caregiver, physical therapist, or athletic trainer. Use a heat pack or a warm water soak. SEEK IMMEDIATE MEDICAL CARE IF:  Symptoms get worse or do not improve in 2 to 4 weeks, despite treatment.  You develop numbness, weakness, or loss of bowel or bladder function.  New, unexplained symptoms develop. (Drugs used in treatment may produce side effects.) EXERCISES RANGE OF MOTION (ROM) AND STRETCHING EXERCISES - Mid-Back Strain These exercises may help you when beginning to rehabilitate your injury. In order to successfully resolve your symptoms, you must improve your posture. These exercises are designed to help reduce the forward-head and rounded-shoulder posture which contributes to this condition. Your symptoms may resolve with or without further involvement from your physician, physical therapist or athletic trainer. While completing these exercises, remember:   Restoring tissue flexibility helps normal motion to return to the joints. This allows healthier, less painful movement and activity.  An effective stretch should be held for at least 30 seconds.  A stretch should never be painful. You should only feel a gentle lengthening or release in the stretched tissue. STRETCH - Axial Extension  Stand or sit on a firm surface. Assume a good posture: chest up, shoulders drawn back, stomach muscles slightly tense, knees unlocked (if standing) and feet hip width apart.  Slowly retract your chin, so your head slides back and your chin slightly lowers. Continue to look straight ahead.  You should feel a gentle stretch in the back of your head. Be  certain not to feel an aggressive stretch since this can cause headaches later.  Hold for __________ seconds. Repeat __________ times. Complete this exercise __________ times per day. RANGE OF MOTION- Upper Thoracic Extension  Sit on a firm chair with a high back. Assume a good posture: chest up, shoulders drawn back, abdominal muscles slightly tense, and feet hip width apart. Place a small pillow or folded towel in the curve of your lower back, if you are having difficulty maintaining good posture.  Gently brace your neck with your hands, allowing your arms to rest on your chest.  Continue to support your neck and slowly extend your back over the chair. You will feel a stretch across your upper back.  Hold __________ seconds. Slowly return to the starting position. Repeat __________ times. Complete this exercise __________ times per day. RANGE OF MOTION- Mid-Thoracic Extension  Roll a towel so that it is about 4 inches in diameter.  Position the towel lengthwise. Lay on the towel so that your spine, but not your shoulder blades, are supported.  You should feel your mid-back arching toward the floor. To increase the stretch, extend your arms away from your body.  Hold for __________ seconds. Repeat exercise __________ times, __________ times per day. STRENGTHENING EXERCISES - Mid-Back Strain These exercises may help you when beginning to rehabilitate your injury. They may resolve your symptoms with or without further involvement from your physician, physical therapist or athletic trainer. While completing these exercises, remember:   Muscles can gain both the endurance and the strength  needed for everyday activities through controlled exercises.  Complete these exercises as instructed by your physician, physical therapist or athletic trainer. Increase the resistance and repetitions only as guided by your caregiver.  You may experience muscle soreness or fatigue, but the pain or  discomfort you are trying to eliminate should never worsen during these exercises. If this pain does worsen, stop and make certain you are following the directions exactly. If the pain is still present after adjustments, discontinue the exercise until you can discuss the trouble with your caregiver. STRENGTHENING - Quadruped, Opposite UE/LE Lift  Assume a hands and knees position on a firm surface. Keep your hands under your shoulders and your knees under your hips. You may place padding under your knees for comfort.  Find your neutral spine and gently tense your abdominal muscles so that you can maintain this position. Your shoulders and hips should form a rectangle that is parallel with the floor and is not twisted.  Keeping your trunk steady, lift your right hand no higher than your shoulder and then your left leg no higher than your hip. Make sure you are not holding your breath. Hold this position __________ seconds.  Continuing to keep your abdominal muscles tense and your back steady, slowly return to your starting position. Repeat with the opposite arm and leg. Repeat __________ times. Complete this exercise __________ times per day.  STRENGTH - Shoulder Extensors  Secure a rubber exercise band or tubing to a fixed object (table, pole) so that it is at the height of your shoulders when you are either standing, or sitting on a firm armless chair.  With a thumbs-up grip, grasp an end of the band in each hand. Straighten your elbows and lift your hands straight in front of you at shoulder height. Step back away from the secured end of band, until it becomes tense.  Squeezing your shoulder blades together, pull your hands down to the sides of your thighs. Do not allow your hands to go behind you.  Hold for __________ seconds. Slowly ease the tension on the band, as you reverse the directions and return to the starting position. Repeat __________ times. Complete this exercise __________ times  per day.  STRENGTH - Horizontal Abductors Choose one of the two positions to complete this exercise. Prone: lying on stomach:  Lie on your stomach on a firm surface so that your right / left arm overhangs the edge. Rest your forehead on your opposite forearm. With your palm facing the floor and your elbow straight, hold a __________ weight in your hand.  Squeeze your right / left shoulder blade to your mid-back spine and then slowly raise your arm to the height of the bed.  Hold for __________ seconds. Slowly reverse the directions and return to the starting position, controlling the weight as you lower your arm. Repeat __________ times. Complete this exercise __________ times per day. Standing:   Secure a rubber exercise band or tubing, so that it is at the height of your shoulders when you are either standing, or sitting on a firm armless chair.  Grasp an end of the band in each hand and have your palms face each other. Straighten your elbows and lift your hands straight in front of you at shoulder height. Step back away from the secured end of band, until it becomes tense.  Squeeze your shoulder blades together. Keeping your elbows locked and your hands at shoulder height, spread your arms apart, forming a "T" shape  with your body. Hold __________ seconds. Slowly ease the tension on the band, as you reverse the directions and return to the starting position. Repeat __________ times. Complete this exercise __________ times per day. STRENGTH - Scapular Retractors and External Rotators, Rowing  Secure a rubber exercise band or tubing, so that it is at the height of your shoulders when you are either standing, or sitting on a firm armless chair.  With a palm-down grip, grasp an end of the band in each hand. Straighten your elbows and lift your hands straight in front of you at shoulder height. Step back away from the secured end of band, until it becomes tense.  Step 1: Squeeze your shoulder  blades together. Bending your elbows, draw your hands to your chest as if you are rowing a boat. At the end of this motion, your hands and elbow should be at shoulder height and your elbows should be out to your sides.  Step 2: Rotate your shoulder to raise your hands above your head. Your forearms should be vertical and your upper arms should be horizontal.  Hold for __________ seconds. Slowly ease the tension on the band, as you reverse the directions and return to the starting position. Repeat __________ times. Complete this exercise __________ times per day.  POSTURE AND BODY MECHANICS CONSIDERATIONS - Mid-Back Strain Keeping correct posture when sitting, standing or completing your activities will reduce the stress put on different body tissues, allowing injured tissues a chance to heal and limiting painful experiences. The following are general guidelines for improved posture. Your physician or physical therapist will provide you with any instructions specific to your needs. While reading these guidelines, remember:  The exercises prescribed by your provider will help you have the flexibility and strength to maintain correct postures.  The correct posture provides the best environment for your joints to work. All of your joints have less wear and tear when properly supported by a spine with good posture. This means you will experience a healthier, less painful body.  Correct posture must be practiced with all of your activities, especially prolonged sitting and standing. Correct posture is as important when doing repetitive low-stress activities (typing) as it is when doing a single heavy-load activity (lifting). PROPER SITTING POSTURE In order to minimize stress and discomfort on your spine, you must sit with correct posture. Sitting with good posture should be effortless for a healthy body. Returning to good posture is a gradual process. Many people can work toward this most comfortably by  using various supports until they have the flexibility and strength to maintain this posture on their own. When sitting with proper posture, your ears will fall over your shoulders and your shoulders will fall over your hips. You should use the back of the chair to support your upper back. Your lower back will be in a neutral position, just slightly arched. You may place a small pillow or folded towel at the base of your low back for  support.  When working at a desk, create an environment that supports good, upright posture. Without extra support, muscles fatigue and lead to excessive strain on joints and other tissues. Keep these recommendations in mind: CHAIR:  A chair should be able to slide under your desk when your back makes contact with the back of the chair. This allows you to work closely.  The chair's height should allow your eyes to be level with the upper part of your monitor and your hands to be  slightly lower than your elbows. BODY POSITION  Your feet should make contact with the floor. If this is not possible, use a foot rest.  Keep your ears over your shoulders. This will reduce stress on your neck and lower back. INCORRECT SITTING POSTURES If you are feeling tired and unable to assume a healthy sitting posture, do not slouch or slump. This puts excessive strain on your back tissues, causing more damage and pain. Healthier options include:  Using more support, like a lumbar pillow.  Switching tasks to something that requires you to be upright or walking.  Talking a brief walk.  Lying down to rest in a neutral-spine position. CORRECT STANDING POSTURES Proper standing posture should be assumed with all daily activities, even if they only take a few moments, like when brushing your teeth. As in sitting, your ears should fall over your shoulders and your shoulders should fall over your hips. You should keep a slight tension in your abdominal muscles to brace your spine. Your  tailbone should point down to the ground, not behind your body, resulting in an over-extended swayback posture.  INCORRECT STANDING POSTURES Common incorrect standing postures include a forward head, locked knees, and an excessive swayback. WALKING Walk with an upright posture. Your ears, shoulders and hips should all line-up. CORRECT LIFTING TECHNIQUES DO :   Assume a wide stance. This will provide you more stability and the opportunity to get as close as possible to the object which you are lifting.  Tense your abdominals to brace your spine. Bend at the knees and hips. Keeping your back locked in a neutral-spine position, lift using your leg muscles. Lift with your legs, keeping your back straight.  Test the weight of unknown objects before attempting to lift them.  Try to keep your elbows locked down at your sides in order get the best strength from your shoulders when carrying an object.  Always ask for help when lifting heavy or awkward objects. INCORRECT LIFTING TECHNIQUES DO NOT:   Lock your knees when lifting, even if it is a small object.  Bend and twist. Pivot at your feet or move your feet when needing to change directions.  Assume that you can safely pick up even a paperclip without proper posture.   This information is not intended to replace advice given to you by your health care provider. Make sure you discuss any questions you have with your health care provider.   Document Released: 04/09/2005 Document Revised: 08/24/2014 Document Reviewed: 07/22/2008 Elsevier Interactive Patient Education [employer].

## 2015-08-04 ENCOUNTER — Encounter: Payer: Self-pay | Admitting: Physician Assistant

## 2015-08-04 ENCOUNTER — Ambulatory Visit (INDEPENDENT_AMBULATORY_CARE_PROVIDER_SITE_OTHER): Payer: BLUE CROSS/BLUE SHIELD | Admitting: Physician Assistant

## 2015-08-04 VITALS — BP 110/66 | HR 97 | Temp 97.3°F | Resp 16 | Ht 66.0 in | Wt 130.8 lb

## 2015-08-04 DIAGNOSIS — M545 Low back pain, unspecified: Secondary | ICD-10-CM

## 2015-08-04 DIAGNOSIS — M546 Pain in thoracic spine: Secondary | ICD-10-CM

## 2015-08-04 MED ORDER — DEXAMETHASONE SODIUM PHOSPHATE 100 MG/10ML IJ SOLN: 20.0000 mg | Freq: Once | INTRAMUSCULAR | Status: DC

## 2015-08-04 MED ORDER — LIDOCAINE 5 % EX PTCH: MEDICATED_PATCH | CUTANEOUS | Status: AC

## 2015-08-04 NOTE — Progress Notes (Signed)
Subjective:    Patient ID: Erica [name], female    DOB: 19-Jul-1965, 50 y.o.   MRN: [id number]  HPI She has chronic lower back pain, has followed with Dr. Lynann Bologna, has done PT that helped but a month ago she felt acute pain after lifting a box, has been using heat, muscle relaxer and had 2 trigger points on the right side that helped however, she has been having pain at the left side. She has been doing stretches and exercises at the gym has not helped.   Blood pressure 110/66, pulse 97, temperature 97.3 F (36.3 C), temperature source Temporal, resp. rate 16, height 5\' 6"  (1.676 m), weight 130 lb 12.8 oz (59.33 kg), SpO2 98 %.  Patient Active Problem List   Diagnosis Date Noted  . Hyperlipidemia 08/16/2014  . Vitamin D deficiency 08/16/2014  . Acne 08/16/2014  . 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 LBP (low back pain) 03/25/2013  . Fibroid   . Environmental allergies     Current Outpatient Prescriptions on File Prior to Visit  Medication Sig Dispense Refill  . Calcium Carbonate-Vitamin D (CALCIUM + D PO) Take by mouth.    . Cholecalciferol (VITAMIN D PO) Take by mouth.    . Coenzyme Q10 (CO Q 10 PO) Take by mouth.    . Cyanocobalamin (VITAMIN B-12 PO) Take by mouth.    . EPINEPHrine 0.3 mg/0.3 mL IJ SOAJ injection Inject 0.3 mg into the muscle.    . fenofibrate (TRICOR) 145 MG tablet Take 1 tablet (145 mg total) by mouth daily. 90 tablet 3  . levocetirizine (XYZAL) 5 MG tablet Take 1 tablet (5 mg total) by mouth every evening. 90 tablet 1  . methocarbamol (ROBAXIN-750) 750 MG tablet Take 1 tablet (750 mg total) by mouth every 8 (eight) hours as needed for muscle spasms. 90 tablet 2  . Multiple Vitamin (THERA) TABS Take 1 tablet by mouth.    . norethindrone-ethinyl estradiol (MICROGESTIN FE 1/20) 1-20 MG-MCG tablet Take 1 tablet by mouth daily. 1 Package 12  . Omega-3 Fatty Acids (OMEGA-3 EPA FISH OIL PO) Take by mouth daily.    Marland Kitchen spironolactone (ALDACTONE) 100 MG tablet Take 1 tablet  (100 mg total) by mouth daily. 90 tablet 1   No current facility-administered medications on file prior to visit.   Review of Systems  Constitutional: Negative.   HENT: Negative.   Eyes: Negative.   Respiratory: Negative.   Cardiovascular: Negative.   Gastrointestinal: Positive for diarrhea (with gluten).  Genitourinary: Negative.   Musculoskeletal: Positive for back pain. Negative for myalgias and neck pain.  Skin: Negative.   Neurological: Negative.  Negative for dizziness, tremors and seizures.  Psychiatric/Behavioral: Negative.        Objective:   Physical Exam  Constitutional: She is oriented to person, place, and time. She appears well-developed and well-nourished.  HENT:  Head: Normocephalic and atraumatic.  Eyes: Conjunctivae are normal. Pupils are equal, round, and reactive to light.  Neck: Normal range of motion. Neck supple.  Cardiovascular: Normal rate and regular rhythm.   Pulmonary/Chest: Effort normal and breath sounds normal.  Abdominal: Soft. Bowel sounds are normal. There is no tenderness.  Musculoskeletal:  Patient is able to ambulate well. Gait is Antalgic. Straight leg raising with dorsiflexion negative bilaterally for radicular symptoms. Sensory exam in the legs are normal. Knee reflexes are normal Ankle reflexes are normal Strength is normal and symmetric in arms and legs. There is SI tenderness to palpation.  There isparaspinal muscle  spasm.  There is not midline tenderness.  ROM of spine with  limited in flexion due to pain.   Lymphadenopathy:    She has no cervical adenopathy.  Neurological: She is alert and oriented to person, place, and time. She has normal reflexes.  Skin: Skin is warm and dry. No rash noted.       Assessment & Plan:  Left SI pain, negative straight leg, no bowel/bladder problems Injection- area cleaned with alcohol, Dexamethasone 10mg  and 1 CC lidocaine injected into left SI tolerated well with immediate relief.  X 2 Mobic, RICE,  and exercise given Lidoderm patches sent in If not better with refer to orthopedics.    Future Appointments Date Time Provider [place]  08/17/2015 2:00 PM Vicie Mutters, PA-C [id number] None

## 2015-08-06 ENCOUNTER — Other Ambulatory Visit: Payer: Self-pay | Admitting: Internal Medicine

## 2015-08-17 ENCOUNTER — Encounter: Payer: Self-pay | Admitting: Physician Assistant

## 2015-09-26 ENCOUNTER — Other Ambulatory Visit: Payer: Self-pay | Admitting: Physician Assistant

## 2015-09-26 DIAGNOSIS — Z79899 Other long term (current) drug therapy: Secondary | ICD-10-CM

## 2015-09-26 MED ORDER — SPIRONOLACTONE 100 MG PO TABS: 150.0000 mg | ORAL_TABLET | Freq: Every day | ORAL | Status: DC

## 2015-11-02 ENCOUNTER — Other Ambulatory Visit: Payer: Self-pay

## 2015-12-21 ENCOUNTER — Other Ambulatory Visit: Payer: Self-pay | Admitting: Gynecology

## 2016-01-03 ENCOUNTER — Encounter: Payer: Self-pay | Admitting: Physician Assistant

## 2016-01-03 ENCOUNTER — Ambulatory Visit (INDEPENDENT_AMBULATORY_CARE_PROVIDER_SITE_OTHER): Payer: BLUE CROSS/BLUE SHIELD | Admitting: Physician Assistant

## 2016-01-03 VITALS — BP 124/70 | HR 88 | Temp 97.9°F | Resp 14 | Ht 66.0 in | Wt 131.8 lb

## 2016-01-03 DIAGNOSIS — R1030 Lower abdominal pain, unspecified: Secondary | ICD-10-CM

## 2016-01-03 NOTE — Progress Notes (Deleted)
HPI: complains of UTI symptoms Onset 4 days ago, progressively worse associated with dysuria and small volume voiding with increased frequency denies hematuria, flank pain or fever The patient has a history of prior UTI  PMH: reviewed  ROS:  Gen.: No unexpected weight change, no night sweats Lungs: No cough or shortness of breath Cardiovascular: No palpitations or chest pain  PE: Ht 5\' 6"  (1.676 m)  General: No acute distress Lungs: Clear to auscultation Cardiovascular: Regular rate rhythm, no edema Abdomen: Mild to moderate discomfort of her suprapubic region, no flank tenderness to palpation  Lab Results  Component Value Date   WBC 6.3 03/07/2015   HGB 12.7 03/07/2015   HCT 37.6 03/07/2015   PLT 414 (H) 03/07/2015   GLUCOSE 90 03/07/2015   CHOL 164 03/07/2015   TRIG 100 03/07/2015   HDL 44 (L) 03/07/2015   LDLCALC 100 03/07/2015   ALT 19 03/07/2015   AST 16 03/07/2015   NA 138 03/07/2015   K 4.3 03/07/2015   CL 104 03/07/2015   CREATININE 0.81 03/07/2015   BUN 17 03/07/2015   CO2 25 03/07/2015   TSH 1.346 03/07/2015   MICROALBUR 0.2 08/16/2014    Assessment/Plan: UTI, classic symptoms with history of same  Empiric antibiotic x7 days Urine culture for identification and sensitivities Hydration recommended education provided

## 2016-01-03 NOTE — Patient Instructions (Signed)
Interstitial Cystitis Interstitial cystitis is a condition that causes inflammation of the bladder. The bladder is a hollow organ in the lower part of your abdomen. It stores urine after the urine is made by your kidneys. With interstitial cystitis, you may have pain in the bladder area. You may also have a frequent and urgent need to urinate. The severity of interstitial cystitis can vary from person to person. You may have flare-ups of the condition, and then it may go away for a while. For many people who have this condition, it becomes a long-term problem. CAUSES The cause of this condition is not known. RISK FACTORS This condition is more likely to develop in women. SYMPTOMS Symptoms of interstitial cystitis vary, and they can change over time. Symptoms may include:  Discomfort or pain in the bladder area. This can range from mild to severe. The pain may change in intensity as the bladder fills with urine or as it empties.  Pelvic pain.  An urgent need to urinate.  Frequent urination.  Pain during sexual intercourse.  Pinpoint bleeding on the bladder wall. For women, the symptoms often get worse during menstruation. DIAGNOSIS This condition is diagnosed by evaluating your symptoms and ruling out other causes. A physical exam will be done. Various tests may be done to rule out other conditions. Common tests include:  Urine tests.  Cystoscopy. In this test, a tool that is like a very thin telescope is used to look into your bladder.  Biopsy. This involves taking a sample of tissue from the bladder wall to be examined under a microscope. TREATMENT There is no cure for interstitial cystitis, but treatment methods are available to control your symptoms. Work closely with your health care provider to find the treatments that will be most effective for you. Treatment options may include:  Medicines to relieve pain and to help reduce the number of times that you feel the need to  urinate.  Bladder training. This involves learning ways to control when you urinate, such as:  Urinating at scheduled times.  Training yourself to delay urination.  Doing exercises (Kegel exercises) to strengthen the muscles that control urine flow.  Lifestyle changes, such as changing your diet or taking steps to control stress.  Use of a device that provides electrical stimulation in order to reduce pain.  A procedure that stretches your bladder by filling it with air or fluid.  Surgery. This is rare. It is only done for extreme cases if other treatments do not help. HOME CARE INSTRUCTIONS  Take medicines only as directed by your health care provider.  Use bladder training techniques as directed.  Keep a bladder diary to find out which foods, liquids, or activities make your symptoms worse.  Use your bladder diary to schedule bathroom trips. If you are away from home, plan to be near a bathroom at each of your scheduled times.  Make sure you urinate just before you leave the house and just before you go to bed.  Do Kegel exercises as directed by your health care provider.  Do not drink alcohol.  Do not use any tobacco products, including cigarettes, chewing tobacco, or electronic cigarettes. If you need help quitting, ask your health care provider.  Make dietary changes as directed by your health care provider. You may need to avoid spicy foods and foods that contain a high amount of potassium.  Limit your drinking of beverages that stimulate urination. These include soda, coffee, and tea.  Keep all follow-up   visits as directed by your health care provider. This is important. SEEK MEDICAL CARE IF:  Your symptoms do not get better after treatment.  Your pain and discomfort are getting worse.  You have more frequent urges to urinate.  You have a fever. SEEK IMMEDIATE MEDICAL CARE IF:  You are not able to control your bladder at all.   This information is not  intended to replace advice given to you by your health care provider. Make sure you discuss any questions you have with your health care provider.   Document Released: 12/09/2003 Document Revised: 04/30/2014 Document Reviewed: 12/15/2013 Elsevier Interactive Patient Education 2016 [employer]  

## 2016-01-03 NOTE — Progress Notes (Signed)
Subjective:    Patient ID: Erica [name], female    DOB: 30-May-1965, 50 y.o.   MRN: [id number]  HPI 50 y.o. WF presents with AB discomfort.  She presents with AB discomfort from umbilicus to vaginal canal. Has had UTI's in the past without symptoms that have lead to kidney infections. No urinary symptoms other than urgency, no fever, chills, denies N/V, diarrhea/constipation. Not sexually active, LMP 2 weeks ago.  Started new job 4 weeks ago, does not have insurance at this time.  Living with her grandmother, alma cochran.   Blood pressure 124/70, pulse 88, temperature 97.9 F (36.6 C), resp. rate 14, height 5\' 6"  (1.676 m), weight 131 lb 12.8 oz (59.8 kg), last menstrual period 12/20/2015, SpO2 99 %.  Medications Current Outpatient Prescriptions on File Prior to Visit  Medication Sig  . Calcium Carbonate-Vitamin D (CALCIUM + D PO) Take by mouth.  . Cholecalciferol (VITAMIN D PO) Take by mouth.  . Coenzyme Q10 (CO Q 10 PO) Take by mouth.  . Cyanocobalamin (VITAMIN B-12 PO) Take by mouth.  . EPINEPHrine 0.3 mg/0.3 mL IJ SOAJ injection Inject 0.3 mg into the muscle.  . fenofibrate (TRICOR) 145 MG tablet Take 1 tablet (145 mg total) by mouth daily.  Marland Kitchen levocetirizine (XYZAL) 5 MG tablet Take 1 tablet (5 mg total) by mouth every evening.  . meloxicam (MOBIC) 15 MG tablet TAKE 1 TABLET BY MOUTH EVERY DAY WITH FOOD AS NEEDED FOR PAIN  . methocarbamol (ROBAXIN-750) 750 MG tablet Take 1 tablet (750 mg total) by mouth every 8 (eight) hours as needed for muscle spasms.  Marland Kitchen MICROGESTIN FE 1/20 1-20 MG-MCG tablet TAKE 1 TABLET BY MOUTH EVERY DAY  . Multiple Vitamin (THERA) TABS Take 1 tablet by mouth.  . Omega-3 Fatty Acids (OMEGA-3 EPA FISH OIL PO) Take by mouth daily.  Marland Kitchen spironolactone (ALDACTONE) 100 MG tablet Take 1.5 tablets (150 mg total) by mouth daily.   Current Facility-Administered Medications on File Prior to Visit  Medication  . dexamethasone (DECADRON) injection 20 mg    Problem  list She has Fibroid; Environmental allergies; Hyperlipidemia; Vitamin D deficiency; Acne; Allergic to food; and LBP (low back pain) on her problem list.  Review of Systems  Constitutional: Negative for chills.  HENT: Negative.   Respiratory: Negative.   Cardiovascular: Negative.   Gastrointestinal: Positive for abdominal pain. Negative for constipation, diarrhea, nausea and vomiting.  Genitourinary: Positive for pelvic pain and urgency. Negative for decreased urine volume, difficulty urinating, dyspareunia, dysuria, enuresis, flank pain, frequency, genital sores, hematuria, menstrual problem, vaginal bleeding and vaginal discharge.       Objective:   Physical Exam  Constitutional: She is oriented to person, place, and time. She appears well-developed and well-nourished.  Neck: Normal range of motion. Neck supple.  Cardiovascular: Normal rate and regular rhythm.   Pulmonary/Chest: Effort normal and breath sounds normal.  Abdominal: Soft. Bowel sounds are normal. She exhibits no mass. There is tenderness in the suprapubic area. There is no rigidity, no rebound, no guarding, no CVA tenderness, no tenderness at McBurney's point and negative Murphy's sign.  Neurological: She is alert and oriented to person, place, and time.  Skin: Skin is warm and dry. No rash noted.  Vitals reviewed.     Assessment & Plan:  1. 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  - Urinalysis, Routine  w reflex microscopic (not at [facility]) - Urine culture

## 2016-01-04 LAB — URINALYSIS, ROUTINE W REFLEX MICROSCOPIC
BILIRUBIN URINE: NEGATIVE
GLUCOSE, UA: NEGATIVE
Hgb urine dipstick: NEGATIVE
Ketones, ur: NEGATIVE
LEUKOCYTES UA: NEGATIVE
Nitrite: NEGATIVE
PROTEIN: NEGATIVE
SPECIFIC GRAVITY, URINE: 1.017 (ref 1.001–1.035)
pH: 6.5 (ref 5.0–8.0)

## 2016-01-04 LAB — URINE CULTURE: Organism ID, Bacteria: 10000

## 2016-01-21 ENCOUNTER — Other Ambulatory Visit: Payer: Self-pay | Admitting: Gynecology

## 2016-03-20 ENCOUNTER — Encounter: Payer: BLUE CROSS/BLUE SHIELD | Admitting: Gynecology

## 2016-04-15 ENCOUNTER — Other Ambulatory Visit: Payer: Self-pay | Admitting: Physician Assistant

## 2016-04-15 DIAGNOSIS — E785 Hyperlipidemia, unspecified: Secondary | ICD-10-CM

## 2016-04-26 ENCOUNTER — Encounter: Payer: BLUE CROSS/BLUE SHIELD | Admitting: Gynecology

## 2016-05-01 ENCOUNTER — Encounter: Payer: BLUE CROSS/BLUE SHIELD | Admitting: Gynecology

## 2016-05-02 ENCOUNTER — Ambulatory Visit (INDEPENDENT_AMBULATORY_CARE_PROVIDER_SITE_OTHER): Payer: BLUE CROSS/BLUE SHIELD | Admitting: Physician Assistant

## 2016-05-02 ENCOUNTER — Encounter: Payer: Self-pay | Admitting: Physician Assistant

## 2016-05-02 VITALS — BP 102/70 | HR 76 | Temp 97.4°F | Resp 14 | Ht 62.0 in | Wt 131.8 lb

## 2016-05-02 DIAGNOSIS — E559 Vitamin D deficiency, unspecified: Secondary | ICD-10-CM | POA: Diagnosis not present

## 2016-05-02 DIAGNOSIS — Z23 Encounter for immunization: Secondary | ICD-10-CM

## 2016-05-02 DIAGNOSIS — E785 Hyperlipidemia, unspecified: Secondary | ICD-10-CM | POA: Diagnosis not present

## 2016-05-02 DIAGNOSIS — Z79899 Other long term (current) drug therapy: Secondary | ICD-10-CM

## 2016-05-02 DIAGNOSIS — M545 Low back pain, unspecified: Secondary | ICD-10-CM

## 2016-05-02 MED ORDER — LEVOCETIRIZINE DIHYDROCHLORIDE 5 MG PO TABS: 5.0000 mg | ORAL_TABLET | Freq: Every evening | ORAL | 1 refills | Status: DC

## 2016-05-02 MED ORDER — SPIRONOLACTONE 100 MG PO TABS: 150.0000 mg | ORAL_TABLET | Freq: Every day | ORAL | 1 refills | Status: DC

## 2016-05-02 MED ORDER — FENOFIBRATE 145 MG PO TABS: ORAL_TABLET | ORAL | 1 refills | Status: DC

## 2016-05-02 MED ORDER — NORETHIN ACE-ETH ESTRAD-FE 1-20 MG-MCG PO TABS: 1.0000 | ORAL_TABLET | Freq: Every day | ORAL | 1 refills | Status: DC

## 2016-05-02 NOTE — Progress Notes (Signed)
Assessment and Plan: 1. Flu vaccine need - Flu vaccine 6-46mo preservative free IM  2. Hyperlipidemia, unspecified hyperlipidemia type - fenofibrate (TRICOR) 145 MG tablet; TAKE 1 TABLET(145 MG) BY MOUTH DAILY  Dispense: 90 tablet; Refill: 1 - TSH; Future - Lipid panel; Future  3. Medication management - CBC with Differential/Platelet; Future - BASIC METABOLIC PANEL WITH GFR; Future - Hepatic function panel; Future  4. Bilateral low back pain without sciatica, unspecified chronicity Continue same  5. Vitamin D deficiency - VITAMIN D 25 Hydroxy (Vit-D Deficiency, Fractures); Future  No future appointments.   HPI 51 y.o.female presents for follow up. Patient lost her job, just recently got insurance again. She could not afford the fenofibrate due to cost but would like to resume it, has been on it for one week. She is on spirolactone for acne and needs kidney function. She has chronic pain and is on robaxin/mobic and does PT that helps. She is on xyzal for allergy mediation. She is living with her grandmother, Dedra Skeens, who is with hospice and this has been stressful. 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    Past Medical History:  Diagnosis Date  . Allergy   . Environmental allergies   . Fibroid   . Hyperlipidemia      Allergies  Allergen Reactions  . Molds & Smuts Rash    Pt was tested at allergist.   . Gluten Meal     Has been tested and received a "false negative"  . Tuna [Fish Allergy] Itching    Hives; hypotension Also anchovies    Current Outpatient Prescriptions on File Prior to Visit  Medication Sig  . Calcium Carbonate-Vitamin D (CALCIUM + D PO) Take by mouth.  . Cholecalciferol (VITAMIN D PO) Take by mouth.  . Coenzyme Q10 (CO Q 10 PO) Take by mouth.  . Cyanocobalamin (VITAMIN B-12 PO) Take by mouth.  . EPINEPHrine 0.3 mg/0.3 mL IJ SOAJ injection Inject 0.3 mg into  the muscle.  . fenofibrate (TRICOR) 145 MG tablet TAKE 1 TABLET(145 MG) BY MOUTH DAILY  . levocetirizine (XYZAL) 5 MG tablet Take 1 tablet (5 mg total) by mouth every evening.  . meloxicam (MOBIC) 15 MG tablet TAKE 1 TABLET BY MOUTH EVERY DAY WITH FOOD AS NEEDED FOR PAIN  . methocarbamol (ROBAXIN-750) 750 MG tablet Take 1 tablet (750 mg total) by mouth every 8 (eight) hours as needed for muscle spasms.  Marland Kitchen MICROGESTIN FE 1/20 1-20 MG-MCG tablet TAKE 1 TABLET BY MOUTH EVERY DAY  . Multiple Vitamin (THERA) TABS Take 1 tablet by mouth.  . Omega-3 Fatty Acids (OMEGA-3 EPA FISH OIL PO) Take by mouth daily.  Marland Kitchen spironolactone (ALDACTONE) 100 MG tablet Take 1.5 tablets (150 mg total) by mouth daily.   Current Facility-Administered Medications on File Prior to Visit  Medication  . dexamethasone (DECADRON) injection 20 mg    ROS: all negative except above.   Physical Exam: Filed Weights   05/02/16 1631  Weight: 131 lb 12.8 oz (59.8 kg)   BP 102/70   Pulse 76   Temp 97.4 F (36.3 C)   Resp 14   Ht 5\' 2"  (1.575 m)   Wt 131 lb 12.8 oz (59.8 kg)   SpO2 97%   BMI 24.11 kg/m  General Appearance: Well nourished, in no apparent distress. Eyes: PERRLA, EOMs, conjunctiva no swelling or erythema Sinuses: No Frontal/maxillary tenderness ENT/Mouth: Ext aud canals clear,  TMs without erythema, bulging. No erythema, swelling, or exudate on post pharynx.  Tonsils not swollen or erythematous. Hearing normal.  Neck: Supple, thyroid normal.  Respiratory: Respiratory effort normal, BS equal bilaterally without rales, rhonchi, wheezing or stridor.  Cardio: RRR with no MRGs. Brisk peripheral pulses without edema.  Abdomen: Soft, + BS.  Non tender, no guarding, rebound, hernias, masses. Lymphatics: Non tender without lymphadenopathy.  Musculoskeletal: Full ROM, 5/5 strength, normal gait.  Skin: Warm, dry without rashes, lesions, ecchymosis.  Neuro: Cranial nerves intact. Normal muscle tone, no cerebellar  symptoms. Sensation intact.  Psych: Awake and oriented X 3, normal affect, Insight and Judgment appropriate.     Vicie Mutters, PA-C 4:51 PM [facility]

## 2016-05-02 NOTE — Patient Instructions (Signed)
Triglycerides are simple sugars in blood that are converted into a storage form. I recommend you avoid fried/greasy foods, sweets/candy, white rice , white potatoes,  anything made from white flour, sweet tea, soda, fruit juices and avoid alcohol in excess. Sweet potatoes, brown/wild rice/Quinoa, Vegetarian, spinach, or wheat pasta, Multi-grain bread - like multi-grain flat bread or sandwich thins are okay.  Marland Kitchen

## 2016-08-21 ENCOUNTER — Encounter: Payer: Self-pay | Admitting: Physician Assistant

## 2016-10-23 ENCOUNTER — Ambulatory Visit (INDEPENDENT_AMBULATORY_CARE_PROVIDER_SITE_OTHER): Payer: BLUE CROSS/BLUE SHIELD | Admitting: Physician Assistant

## 2016-10-23 ENCOUNTER — Encounter: Payer: Self-pay | Admitting: Physician Assistant

## 2016-10-23 VITALS — BP 100/72 | HR 88 | Temp 97.3°F | Wt 134.0 lb

## 2016-10-23 DIAGNOSIS — E785 Hyperlipidemia, unspecified: Secondary | ICD-10-CM

## 2016-10-23 DIAGNOSIS — Z79899 Other long term (current) drug therapy: Secondary | ICD-10-CM | POA: Diagnosis not present

## 2016-10-23 DIAGNOSIS — L709 Acne, unspecified: Secondary | ICD-10-CM

## 2016-10-23 DIAGNOSIS — E559 Vitamin D deficiency, unspecified: Secondary | ICD-10-CM

## 2016-10-23 DIAGNOSIS — G8929 Other chronic pain: Secondary | ICD-10-CM | POA: Diagnosis not present

## 2016-10-23 DIAGNOSIS — M545 Low back pain: Secondary | ICD-10-CM

## 2016-10-23 LAB — HEPATIC FUNCTION PANEL
ALT: 15 U/L (ref 6–29)
AST: 15 U/L (ref 10–35)
Albumin: 4.3 g/dL (ref 3.6–5.1)
Alkaline Phosphatase: 34 U/L (ref 33–130)
BILIRUBIN INDIRECT: 0.2 mg/dL (ref 0.2–1.2)
Bilirubin, Direct: 0.1 mg/dL (ref <=0.2)
TOTAL PROTEIN: 7 g/dL (ref 6.1–8.1)
Total Bilirubin: 0.3 mg/dL (ref 0.2–1.2)

## 2016-10-23 LAB — BASIC METABOLIC PANEL WITH GFR
BUN: 16 mg/dL (ref 7–25)
CALCIUM: 9.7 mg/dL (ref 8.6–10.4)
CHLORIDE: 101 mmol/L (ref 98–110)
CO2: 25 mmol/L (ref 20–31)
CREATININE: 1.04 mg/dL (ref 0.50–1.05)
GFR, Est African American: 71 mL/min (ref >=60)
GFR, Est Non African American: 61 mL/min (ref >=60)
GLUCOSE: 78 mg/dL (ref 65–99)
Potassium: 4.5 mmol/L (ref 3.5–5.3)
Sodium: 138 mmol/L (ref 135–146)

## 2016-10-23 LAB — LIPID PANEL
CHOLESTEROL: 185 mg/dL (ref <200)
HDL: 52 mg/dL (ref >50)
LDL Cholesterol: 111 mg/dL — ABNORMAL HIGH (ref <100)
Total CHOL/HDL Ratio: 3.6 Ratio (ref <5.0)
Triglycerides: 109 mg/dL (ref <150)
VLDL: 22 mg/dL (ref <30)

## 2016-10-23 LAB — CBC WITH DIFFERENTIAL/PLATELET
BASOS ABS: 71 {cells}/uL (ref 0–200)
Basophils Relative: 1 %
EOS ABS: 213 {cells}/uL (ref 15–500)
Eosinophils Relative: 3 %
HEMATOCRIT: 40.7 % (ref 35.0–45.0)
HEMOGLOBIN: 13.7 g/dL (ref 11.7–15.5)
LYMPHS ABS: 1846 {cells}/uL (ref 850–3900)
Lymphocytes Relative: 26 %
MCH: 31.8 pg (ref 27.0–33.0)
MCHC: 33.7 g/dL (ref 32.0–36.0)
MCV: 94.4 fL (ref 80.0–100.0)
MONO ABS: 497 {cells}/uL (ref 200–950)
MPV: 9.6 fL (ref 7.5–12.5)
Monocytes Relative: 7 %
NEUTROS PCT: 63 %
Neutro Abs: 4473 cells/uL (ref 1500–7800)
Platelets: 439 10*3/uL — ABNORMAL HIGH (ref 140–400)
RBC: 4.31 MIL/uL (ref 3.80–5.10)
RDW: 13 % (ref 11.0–15.0)
WBC: 7.1 10*3/uL (ref 3.8–10.8)

## 2016-10-23 LAB — TSH: TSH: 0.98 m[IU]/L

## 2016-10-23 MED ORDER — FENOFIBRATE 145 MG PO TABS: ORAL_TABLET | ORAL | 1 refills | Status: DC

## 2016-10-23 MED ORDER — NORETHIN ACE-ETH ESTRAD-FE 1-20 MG-MCG PO TABS: 1.0000 | ORAL_TABLET | Freq: Every day | ORAL | 1 refills | Status: DC

## 2016-10-23 MED ORDER — LEVOCETIRIZINE DIHYDROCHLORIDE 5 MG PO TABS: 5.0000 mg | ORAL_TABLET | Freq: Every evening | ORAL | 1 refills | Status: DC

## 2016-10-23 MED ORDER — SPIRONOLACTONE 100 MG PO TABS: 150.0000 mg | ORAL_TABLET | Freq: Every day | ORAL | 1 refills | Status: DC

## 2016-10-23 NOTE — Progress Notes (Signed)
Assessment and Plan:   Hypertension -Continue medication, monitor blood pressure at home. Continue DASH diet.  Reminder to go to the ER if any CP, SOB, nausea, dizziness, severe HA, changes vision/speech, left arm numbness and tingling and jaw pain.  Cholesterol -Continue diet and exercise. Check cholesterol.   Vitamin D Def - check level and continue medications.   Chronic right-sided low back pain without sciatica May send to dry needling in the future, not at this time  Acne, unspecified acne type Continue spirolactone.   Continue diet and meds as discussed. Further disposition pending results of labs. Over 30 minutes of exam, counseling, chart review, and critical decision making was performed  No future appointments.   HPI 51 y.o. female  presents for 3 month follow up on hypertension, cholesterol, prediabetes, and vitamin D deficiency.   Was taking care of her grandmother, Dedra Skeens that passed in Feb.  Has chronic lower back pain, has not tried dry needling but would like to try this.  Doing well with her job.  Is on spirloactone for her acne that helps.  Her blood pressure has been controlled at home, today their BP is    She does not workout. She denies chest pain, shortness of breath, dizziness.  She is on cholesterol medication, back on fenofibrate and denies myalgias. Her cholesterol is at goal. The cholesterol last visit was:   Lab Results  Component Value Date   CHOL 164 03/07/2015   HDL 44 (L) 03/07/2015   LDLCALC 100 03/07/2015   TRIG 100 03/07/2015   CHOLHDL 3.7 03/07/2015    Patient is on Vitamin D supplement.   Lab Results  Component Value Date   VD25OH 46 03/07/2015       Current Medications:  Current Outpatient Prescriptions on File Prior to Visit  Medication Sig  . Calcium Carbonate-Vitamin D (CALCIUM + D PO) Take by mouth.  . Cholecalciferol (VITAMIN D PO) Take by mouth.  . Coenzyme Q10 (CO Q 10 PO) Take by mouth.  . Cyanocobalamin (VITAMIN  B-12 PO) Take by mouth.  . EPINEPHrine 0.3 mg/0.3 mL IJ SOAJ injection Inject 0.3 mg into the muscle.  . fenofibrate (TRICOR) 145 MG tablet TAKE 1 TABLET(145 MG) BY MOUTH DAILY  . levocetirizine (XYZAL) 5 MG tablet Take 1 tablet (5 mg total) by mouth every evening.  . meloxicam (MOBIC) 15 MG tablet TAKE 1 TABLET BY MOUTH EVERY DAY WITH FOOD AS NEEDED FOR PAIN  . methocarbamol (ROBAXIN-750) 750 MG tablet Take 1 tablet (750 mg total) by mouth every 8 (eight) hours as needed for muscle spasms.  . Multiple Vitamin (THERA) TABS Take 1 tablet by mouth.  . norethindrone-ethinyl estradiol (MICROGESTIN FE 1/20) 1-20 MG-MCG tablet Take 1 tablet by mouth daily.  . Omega-3 Fatty Acids (OMEGA-3 EPA FISH OIL PO) Take by mouth daily.  Marland Kitchen spironolactone (ALDACTONE) 100 MG tablet Take 1.5 tablets (150 mg total) by mouth daily.   Current Facility-Administered Medications on File Prior to Visit  Medication  . dexamethasone (DECADRON) injection 20 mg    Medical History:  Past Medical History:  Diagnosis Date  . Allergy   . Environmental allergies   . Fibroid   . Hyperlipidemia    Allergies:  Allergies  Allergen Reactions  . Molds & Smuts Rash    Pt was tested at allergist.   . Gluten Meal     Has been tested and received a "false negative"  . Tuna [Fish Allergy] Itching    Hives;  hypotension Also anchovies     Review of Systems:  ROS  Family history- Review and unchanged Social history- Review and unchanged Physical Exam: There were no vitals taken for this visit. Wt Readings from Last 3 Encounters:  05/02/16 131 lb 12.8 oz (59.8 kg)  01/03/16 131 lb 12.8 oz (59.8 kg)  08/04/15 130 lb 12.8 oz (59.3 kg)   General Appearance: Well nourished, in no apparent distress. Eyes: PERRLA, EOMs, conjunctiva no swelling or erythema Sinuses: No Frontal/maxillary tenderness ENT/Mouth: Ext aud canals clear, TMs without erythema, bulging. No erythema, swelling, or exudate on post pharynx.  Tonsils not  swollen or erythematous. Hearing normal.  Neck: Supple, thyroid normal.  Respiratory: Respiratory effort normal, BS equal bilaterally without rales, rhonchi, wheezing or stridor.  Cardio: RRR with no MRGs. Brisk peripheral pulses without edema.  Abdomen: Soft, + BS,  Non tender, no guarding, rebound, hernias, masses. Lymphatics: Non tender without lymphadenopathy.  Musculoskeletal: Full ROM, 5/5 strength, Normal gait Skin: Warm, dry without rashes, lesions, ecchymosis.  Neuro: Cranial nerves intact. Normal muscle tone, no cerebellar symptoms. Psych: Awake and oriented X 3, normal affect, Insight and Judgment appropriate.    Vicie Mutters, PA-C 2:49 PM [facility]

## 2016-10-24 LAB — MAGNESIUM: Magnesium: 2 mg/dL (ref 1.5–2.5)

## 2016-10-25 NOTE — Progress Notes (Signed)
LVM for pt to return office call for LAB results.

## 2016-10-25 NOTE — Progress Notes (Signed)
Pt aware of lab results & voiced understanding of those results.

## 2016-11-05 ENCOUNTER — Ambulatory Visit: Payer: Self-pay | Admitting: Physician Assistant

## 2017-04-09 ENCOUNTER — Other Ambulatory Visit: Payer: Self-pay

## 2017-04-09 MED ORDER — NORETHIN ACE-ETH ESTRAD-FE 1-20 MG-MCG PO TABS: 1.0000 | ORAL_TABLET | Freq: Every day | ORAL | 0 refills | Status: DC

## 2017-05-02 ENCOUNTER — Ambulatory Visit: Payer: Self-pay | Admitting: Physician Assistant

## 2017-05-10 NOTE — Progress Notes (Signed)
Complete Physical  Assessment and Plan: Jadalyn was seen today for annual exam.  Diagnoses and all orders for this visit:  Routine general medical examination at a health care facility  Hyperlipidemia, unspecified hyperlipidemia type -     Lipid panel -     fenofibrate (TRICOR) 145 MG tablet; TAKE 1 TABLET(145 MG) BY MOUTH DAILY  Chronic right-sided low back pain without sciatica  Environmental allergies -     levocetirizine (XYZAL) 5 MG tablet; Take 1 tablet (5 mg total) by mouth every evening.  Vitamin D deficiency -     VITAMIN D 25 Hydroxy (Vit-D Deficiency, Fractures)  Acne, unspecified acne type -     spironolactone (ALDACTONE) 100 MG tablet; Take 1.5 tablets (150 mg total) by mouth daily.  Medication management -     CBC with Differential/Platelet -     BASIC METABOLIC PANEL WITH GFR -     Hepatic function panel -     Magnesium  Screening for cardiovascular condition -     EKG 12-Lead  Screening for hematuria or proteinuria -     Urinalysis, Routine w reflex microscopic -     Microalbumin / creatinine urine ratio  Screening, anemia, deficiency, iron -     Iron,Total/Total Iron Binding Cap -     Vitamin B12  BMI 22.0-22.9, adult  Screening for thyroid disorder -     TSH  Acute vaginitis -     fluconazole (DIFLUCAN) 150 MG tablet; Take 1 tablet (150 mg total) by mouth daily.  Other orders -     norethindrone-ethinyl estradiol (MICROGESTIN FE 1/20) 1-20 MG-MCG tablet; Take 1 tablet by mouth daily.     Discussed med's effects and SE's. Screening labs and tests as requested with regular follow-up as recommended. Over 40 minutes of exam, counseling, chart review, and critical decision making was performed this visit.   HPI  52 y.o. female  presents for a complete physical. .  Her blood pressure has been controlled at home, today their BP is BP: 116/80 She does not workout due to lower back pain.  She denies chest pain, shortness of breath, dizziness.   She is not on cholesterol medication and denies myalgias. Her cholesterol is at goal. The cholesterol last visit was:   Lab Results  Component Value Date   CHOL 185 10/23/2016   HDL 52 10/23/2016   LDLCALC 111 (H) 10/23/2016   TRIG 109 10/23/2016   CHOLHDL 3.6 10/23/2016    Patient is not on Vitamin D supplement Lab Results  Component Value Date   VD25OH 46 03/07/2015   She is post menopausal, on OTC hormonal therapy that she states helps. Has OV at GYN next month.  She is having vaginal discharge, requesting diflucan.  She has chronic lower back pain and has seen Dr. Nelva Bush in the past, states PT has not helped.  She is on spirolactone for her acne and follows with Dr. Ubaldo Glassing.  She also has stopped gluten that has helped her with her allergies and thinks she may have gluten allergy/celiac, will refer GI since due for colonoscopy.  Continuing lower back pain, muscular, worse with bending. Patient denies fever, hematuria, incontinence, numbness, tingling, weakness and saddle anesthesia  Current Medications:  Current Outpatient Medications on File Prior to Visit  Medication Sig  . Calcium Carbonate-Vitamin D (CALCIUM + D PO) Take by mouth.  . Cholecalciferol (VITAMIN D PO) Take by mouth.  . Coenzyme Q10 (CO Q 10 PO) Take by mouth.  Marland Kitchen  Cyanocobalamin (VITAMIN B-12 PO) Take by mouth.  . EPINEPHrine 0.3 mg/0.3 mL IJ SOAJ injection Inject 0.3 mg into the muscle.  . fenofibrate (TRICOR) 145 MG tablet TAKE 1 TABLET(145 MG) BY MOUTH DAILY  . levocetirizine (XYZAL) 5 MG tablet Take 1 tablet (5 mg total) by mouth every evening.  . Multiple Vitamin (THERA) TABS Take 1 tablet by mouth.  . norethindrone-ethinyl estradiol (MICROGESTIN FE 1/20) 1-20 MG-MCG tablet Take 1 tablet by mouth daily.  . Omega-3 Fatty Acids (OMEGA-3 EPA FISH OIL PO) Take by mouth daily.  Marland Kitchen spironolactone (ALDACTONE) 100 MG tablet Take 1.5 tablets (150 mg total) by mouth daily.   Current Facility-Administered Medications on  File Prior to Visit  Medication  . dexamethasone (DECADRON) injection 20 mg   Immunization History  Administered Date(s) Administered  . Influenza Split 01/19/2015  . Influenza, Seasonal, Injecte, Preservative Fre 05/02/2016    Health Maintenance:   Tetanus: 2008 DUE Pneumovax: N/A Prevnar 13: N/A Flu vaccine: 2018 Zostavax: N/A  LMP: 2015 post meonpausal Pap: 11/2014 MGM:12/2014 OVERDUE DEXA: N/A Colonoscopy: DUE EGD: N/A CT AB 2009 CTA 2012  Last Dental Exam: Dr. Jabier Mutton, q 6 months Last Eye Exam: [facility], glasses, q 1-2 years  Patient Care Team: Unk Pinto, MD as PCP - General (Internal Medicine) Phineas Real Belinda Block, MD as Consulting Physician (Gynecology) Suella Broad, MD as Consulting Physician (Physical Medicine and Rehabilitation) Rolm Bookbinder, MD as Consulting Physician (Dermatology) Bobbitt, [place] Muta, MD as Consulting Physician (Allergy and Immunology)  Medical History:  Past Medical History:  Diagnosis Date  . Allergy   . Environmental allergies   . Fibroid   . Hyperlipidemia    Allergies Allergies  Allergen Reactions  . Molds & Smuts Rash    Pt was tested at allergist.   . Gluten Meal     Has been tested and received a "false negative"  . Tuna [Fish Allergy] Itching    Hives; hypotension Also anchovies    SURGICAL HISTORY She  has a past surgical history that includes Appendectomy; Myomectomy; and Hysteroscopy (2007). FAMILY HISTORY Her family history includes Cancer in her mother; Heart disease in her maternal grandmother. SOCIAL HISTORY She  reports that  has never smoked. she has never used smokeless tobacco. She reports that she drinks alcohol. She reports that she does not use drugs. 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   Review of Systems: Review of Systems  Constitutional: Negative for chills, diaphoresis, fever, malaise/fatigue and weight loss.  HENT: Negative  for congestion, ear discharge, ear pain, hearing loss, nosebleeds, sore throat and tinnitus.   Eyes: Negative.   Respiratory: Negative.  Negative for stridor.   Cardiovascular: Negative.   Gastrointestinal: Negative.   Genitourinary: Negative.        Thick white discharge  Musculoskeletal: Positive for back pain. Negative for falls, joint pain, myalgias and neck pain.  Skin: Negative.   Neurological: Negative.  Negative for weakness and headaches.  Psychiatric/Behavioral: Negative.     Physical Exam: Estimated body mass index is 22.98 kg/m as calculated from the following:   Height as of this encounter: 5\' 6"  (1.676 m).   Weight as of this encounter: 142 lb 6.4 oz (64.6 kg). BP 116/80   Pulse 87   Temp (!) 97.5 F (36.4 C)   Resp 14   Ht 5\' 6"  (1.676 m) Comment: w/ shoes  Wt 142 lb 6.4 oz (64.6 kg)   SpO2 98%   BMI 22.98  kg/m  General Appearance: Well nourished, in no apparent distress.  Eyes: PERRLA, EOMs, conjunctiva no swelling or erythema, normal fundi and vessels.  Sinuses: No Frontal/maxillary tenderness  ENT/Mouth: Ext aud canals clear, normal light reflex with TMs without erythema, bulging. Good dentition. No erythema, swelling, or exudate on post pharynx. Tonsils not swollen or erythematous. Hearing normal.  Neck: Supple, thyroid normal. No bruits  Respiratory: Respiratory effort normal, BS equal bilaterally without rales, rhonchi, wheezing or stridor.  Cardio: RRR without murmurs, rubs or gallops. Brisk peripheral pulses without edema.  Chest: symmetric, with normal excursions and percussion.  Breasts: defer  Abdomen: Soft, nontender, no guarding, rebound, hernias, masses, or organomegaly.  Lymphatics: Non tender without lymphadenopathy.  Genitourinary: defer Musculoskeletal: Full ROM all peripheral extremities,5/5 strength, and normal gait.  Skin: Warm, dry without rashes, lesions, ecchymosis. Neuro: Cranial nerves intact, reflexes equal bilaterally. Normal muscle  tone, no cerebellar symptoms. Sensation intact.  Psych: Awake and oriented X 3, normal affect, Insight and Judgment appropriate.   EKG: WNL, IRBBB, no ST changes rate 74 AORTA SCAN: defer  Vicie Mutters 9:25 AM [facility]

## 2017-05-14 ENCOUNTER — Ambulatory Visit (INDEPENDENT_AMBULATORY_CARE_PROVIDER_SITE_OTHER): Payer: BLUE CROSS/BLUE SHIELD | Admitting: Physician Assistant

## 2017-05-14 ENCOUNTER — Encounter: Payer: BLUE CROSS/BLUE SHIELD | Admitting: Gynecology

## 2017-05-14 ENCOUNTER — Encounter: Payer: Self-pay | Admitting: Physician Assistant

## 2017-05-14 VITALS — BP 116/80 | HR 87 | Temp 97.5°F | Resp 14 | Ht 66.0 in | Wt 142.4 lb

## 2017-05-14 DIAGNOSIS — E785 Hyperlipidemia, unspecified: Secondary | ICD-10-CM

## 2017-05-14 DIAGNOSIS — M545 Low back pain, unspecified: Secondary | ICD-10-CM

## 2017-05-14 DIAGNOSIS — Z136 Encounter for screening for cardiovascular disorders: Secondary | ICD-10-CM | POA: Diagnosis not present

## 2017-05-14 DIAGNOSIS — E559 Vitamin D deficiency, unspecified: Secondary | ICD-10-CM | POA: Diagnosis not present

## 2017-05-14 DIAGNOSIS — Z130 Encounter for screening for diseases of the blood and blood-forming organs and certain disorders involving the immune mechanism: Secondary | ICD-10-CM | POA: Diagnosis not present

## 2017-05-14 DIAGNOSIS — Z23 Encounter for immunization: Secondary | ICD-10-CM | POA: Diagnosis not present

## 2017-05-14 DIAGNOSIS — Z6822 Body mass index (BMI) 22.0-22.9, adult: Secondary | ICD-10-CM

## 2017-05-14 DIAGNOSIS — L709 Acne, unspecified: Secondary | ICD-10-CM

## 2017-05-14 DIAGNOSIS — G8929 Other chronic pain: Secondary | ICD-10-CM

## 2017-05-14 DIAGNOSIS — Z79899 Other long term (current) drug therapy: Secondary | ICD-10-CM

## 2017-05-14 DIAGNOSIS — Z1329 Encounter for screening for other suspected endocrine disorder: Secondary | ICD-10-CM | POA: Diagnosis not present

## 2017-05-14 DIAGNOSIS — N760 Acute vaginitis: Secondary | ICD-10-CM

## 2017-05-14 DIAGNOSIS — I10 Essential (primary) hypertension: Secondary | ICD-10-CM | POA: Diagnosis not present

## 2017-05-14 DIAGNOSIS — Z1389 Encounter for screening for other disorder: Secondary | ICD-10-CM

## 2017-05-14 DIAGNOSIS — Z0000 Encounter for general adult medical examination without abnormal findings: Secondary | ICD-10-CM

## 2017-05-14 DIAGNOSIS — Z9109 Other allergy status, other than to drugs and biological substances: Secondary | ICD-10-CM

## 2017-05-14 MED ORDER — FLUCONAZOLE 150 MG PO TABS: 150.0000 mg | ORAL_TABLET | Freq: Every day | ORAL | 3 refills | Status: DC

## 2017-05-14 MED ORDER — SPIRONOLACTONE 100 MG PO TABS: 150.0000 mg | ORAL_TABLET | Freq: Every day | ORAL | 3 refills | Status: DC

## 2017-05-14 MED ORDER — LEVOCETIRIZINE DIHYDROCHLORIDE 5 MG PO TABS: 5.0000 mg | ORAL_TABLET | Freq: Every evening | ORAL | 3 refills | Status: DC

## 2017-05-14 MED ORDER — NORETHIN ACE-ETH ESTRAD-FE 1-20 MG-MCG PO TABS: 1.0000 | ORAL_TABLET | Freq: Every day | ORAL | 0 refills | Status: DC

## 2017-05-14 MED ORDER — FENOFIBRATE 145 MG PO TABS: ORAL_TABLET | ORAL | 3 refills | Status: DC

## 2017-05-14 NOTE — Addendum Note (Signed)
Addended by: Elsie Amis D on: 05/14/2017 10:22 AM   Modules accepted: Orders

## 2017-05-14 NOTE — Patient Instructions (Signed)
   [facility] Schedule an appointment by calling [phone].   Encourage you to get the 3D Mammogram  The 3D Mammogram is much more specific and sensitive to pick up breast cancer. For women with fibrocystic breast or lumpy breast it can be hard to determine if it is cancer or not but the 3D mammogram is able to tell this difference which cuts back on unneeded additional tests or scary call backs.   - over 40% increase in detection of breast cancer - over 40% reduction in false positives.  - fewer call backs - reduced anxiety - improved outcomes - [facility]

## 2017-05-15 ENCOUNTER — Other Ambulatory Visit: Payer: Self-pay | Admitting: Physician Assistant

## 2017-05-15 LAB — CBC WITH DIFFERENTIAL/PLATELET
BASOS ABS: 39 {cells}/uL (ref 0–200)
Basophils Relative: 0.7 %
EOS ABS: 118 {cells}/uL (ref 15–500)
Eosinophils Relative: 2.1 %
HCT: 40.7 % (ref 35.0–45.0)
HEMOGLOBIN: 13.9 g/dL (ref 11.7–15.5)
LYMPHS ABS: 1534 {cells}/uL (ref 850–3900)
MCH: 31.5 pg (ref 27.0–33.0)
MCHC: 34.2 g/dL (ref 32.0–36.0)
MCV: 92.3 fL (ref 80.0–100.0)
MPV: 9.9 fL (ref 7.5–12.5)
Monocytes Relative: 6.8 %
NEUTROS ABS: 3528 {cells}/uL (ref 1500–7800)
Neutrophils Relative %: 63 %
Platelets: 438 10*3/uL — ABNORMAL HIGH (ref 140–400)
RBC: 4.41 10*6/uL (ref 3.80–5.10)
RDW: 11.8 % (ref 11.0–15.0)
Total Lymphocyte: 27.4 %
WBC mixed population: 381 cells/uL (ref 200–950)
WBC: 5.6 10*3/uL (ref 3.8–10.8)

## 2017-05-15 LAB — IRON, TOTAL/TOTAL IRON BINDING CAP
%SAT: 31 % (calc) (ref 11–50)
Iron: 164 ug/dL — ABNORMAL HIGH (ref 45–160)
TIBC: 529 ug/dL — AB (ref 250–450)

## 2017-05-15 LAB — URINALYSIS, ROUTINE W REFLEX MICROSCOPIC
Bilirubin Urine: NEGATIVE
Glucose, UA: NEGATIVE
HGB URINE DIPSTICK: NEGATIVE
Ketones, ur: NEGATIVE
LEUKOCYTES UA: NEGATIVE
NITRITE: NEGATIVE
PROTEIN: NEGATIVE
Specific Gravity, Urine: 1.017 (ref 1.001–1.03)
pH: 7 (ref 5.0–8.0)

## 2017-05-15 LAB — HEPATIC FUNCTION PANEL
AG RATIO: 1.7 (calc) (ref 1.0–2.5)
ALKALINE PHOSPHATASE (APISO): 44 U/L (ref 33–130)
ALT: 24 U/L (ref 6–29)
AST: 16 U/L (ref 10–35)
Albumin: 4.7 g/dL (ref 3.6–5.1)
BILIRUBIN TOTAL: 0.4 mg/dL (ref 0.2–1.2)
Bilirubin, Direct: 0.1 mg/dL (ref 0.0–0.2)
Globulin: 2.8 g/dL (calc) (ref 1.9–3.7)
Indirect Bilirubin: 0.3 mg/dL (calc) (ref 0.2–1.2)
Total Protein: 7.5 g/dL (ref 6.1–8.1)

## 2017-05-15 LAB — MAGNESIUM: Magnesium: 2.1 mg/dL (ref 1.5–2.5)

## 2017-05-15 LAB — BASIC METABOLIC PANEL WITH GFR
BUN: 17 mg/dL (ref 7–25)
CO2: 28 mmol/L (ref 20–32)
CREATININE: 1.02 mg/dL (ref 0.50–1.05)
Calcium: 10.5 mg/dL — ABNORMAL HIGH (ref 8.6–10.4)
Chloride: 103 mmol/L (ref 98–110)
GFR, Est African American: 73 mL/min/{1.73_m2} (ref >=60)
GFR, Est Non African American: 63 mL/min/{1.73_m2} (ref >=60)
Glucose, Bld: 97 mg/dL (ref 65–99)
Potassium: 4.9 mmol/L (ref 3.5–5.3)
SODIUM: 141 mmol/L (ref 135–146)

## 2017-05-15 LAB — LIPID PANEL
CHOL/HDL RATIO: 3.6 (calc) (ref <5.0)
Cholesterol: 213 mg/dL — ABNORMAL HIGH (ref <200)
HDL: 60 mg/dL (ref >50)
LDL CHOLESTEROL (CALC): 127 mg/dL — AB
NON-HDL CHOLESTEROL (CALC): 153 mg/dL — AB (ref <130)
TRIGLYCERIDES: 144 mg/dL (ref <150)

## 2017-05-15 LAB — MICROALBUMIN / CREATININE URINE RATIO
Creatinine, Urine: 93 mg/dL (ref 20–275)
MICROALB UR: 0.2 mg/dL
MICROALB/CREAT RATIO: 2 ug/mg{creat} (ref <30)

## 2017-05-15 LAB — TSH: TSH: 1.26 mIU/L

## 2017-05-15 LAB — VITAMIN D 25 HYDROXY (VIT D DEFICIENCY, FRACTURES): VIT D 25 HYDROXY: 29 ng/mL — AB (ref 30–100)

## 2017-05-15 LAB — VITAMIN B12: Vitamin B-12: 450 pg/mL (ref 200–1100)

## 2017-06-18 ENCOUNTER — Encounter: Payer: BLUE CROSS/BLUE SHIELD | Admitting: Gynecology

## 2017-07-01 ENCOUNTER — Ambulatory Visit (INDEPENDENT_AMBULATORY_CARE_PROVIDER_SITE_OTHER): Payer: BLUE CROSS/BLUE SHIELD | Admitting: Physician Assistant

## 2017-07-01 ENCOUNTER — Encounter: Payer: Self-pay | Admitting: Physician Assistant

## 2017-07-01 VITALS — BP 110/66 | HR 88 | Temp 97.9°F | Resp 14 | Ht 66.0 in | Wt 141.8 lb

## 2017-07-01 DIAGNOSIS — J0100 Acute maxillary sinusitis, unspecified: Secondary | ICD-10-CM | POA: Diagnosis not present

## 2017-07-01 MED ORDER — PREDNISONE 20 MG PO TABS: ORAL_TABLET | ORAL | 0 refills | Status: DC

## 2017-07-01 MED ORDER — AZITHROMYCIN 250 MG PO TABS: ORAL_TABLET | ORAL | 1 refills | Status: AC

## 2017-07-01 MED ORDER — NORETHIN ACE-ETH ESTRAD-FE 1-20 MG-MCG PO TABS: 1.0000 | ORAL_TABLET | Freq: Every day | ORAL | 0 refills | Status: DC

## 2017-07-01 NOTE — Progress Notes (Signed)
   Subjective:    Patient ID: Erica [name], female    DOB: [birth year], 52 y.o.   MRN: [id number]  HPI 52 y.o. WF with history of allergies presents with 11 days of URI/sinus symptoms.  Woke up with sore throat x March 1st, has not had flu shot, has been in bed frequently, thought she has getting better but then worse last 4 days. She has fatigue, sinus congestion, chills but no fever, no teeth/gum. Mucinex, tylenol/cold and flu, cold calm.   Pulse 88, temperature 97.9 F (36.6 C), resp. rate 14, height 5\' 6"  (1.676 m), weight 141 lb 12.8 oz (64.3 kg), SpO2 99 %.  Medications Current Outpatient Medications on File Prior to Visit  Medication Sig  . Calcium Carbonate-Vitamin D (CALCIUM + D PO) Take by mouth.  . Cholecalciferol (VITAMIN D PO) Take by mouth.  . Coenzyme Q10 (CO Q 10 PO) Take by mouth.  . Cyanocobalamin (VITAMIN B-12 PO) Take by mouth.  . EPINEPHrine 0.3 mg/0.3 mL IJ SOAJ injection Inject 0.3 mg into the muscle.  . fenofibrate (TRICOR) 145 MG tablet TAKE 1 TABLET(145 MG) BY MOUTH DAILY  . levocetirizine (XYZAL) 5 MG tablet Take 1 tablet (5 mg total) by mouth every evening.  . Multiple Vitamin (THERA) TABS Take 1 tablet by mouth.  . norethindrone-ethinyl estradiol (MICROGESTIN FE 1/20) 1-20 MG-MCG tablet Take 1 tablet by mouth daily.  . Omega-3 Fatty Acids (OMEGA-3 EPA FISH OIL PO) Take by mouth daily.  Marland Kitchen spironolactone (ALDACTONE) 100 MG tablet Take 1.5 tablets (150 mg total) by mouth daily.   Current Facility-Administered Medications on File Prior to Visit  Medication  . dexamethasone (DECADRON) injection 20 mg    Problem list She has Fibroid; Environmental allergies; Hyperlipidemia; Vitamin D deficiency; Acne; Allergic to food; and Low back pain on their problem list.   Review of Systems  Constitutional: Negative for chills and diaphoresis.  HENT: Positive for congestion, postnasal drip and sinus pressure. Negative for ear pain, sneezing and sore throat.    Respiratory: Negative for cough, chest tightness, shortness of breath and wheezing.   Cardiovascular: Negative.   Gastrointestinal: Negative.   Genitourinary: Negative.   Musculoskeletal: Negative for neck pain.  Neurological: Positive for headaches.       Objective:   Physical Exam  Constitutional: She appears well-developed and well-nourished.  HENT:  Head: Normocephalic and atraumatic.  Right Ear: External ear normal.  Nose: Right sinus exhibits maxillary sinus tenderness. Right sinus exhibits no frontal sinus tenderness. Left sinus exhibits maxillary sinus tenderness. Left sinus exhibits no frontal sinus tenderness.  Eyes: Conjunctivae and EOM are normal.  Neck: Normal range of motion. Neck supple.  Cardiovascular: Normal rate, regular rhythm, normal heart sounds and intact distal pulses.  Pulmonary/Chest: Effort normal and breath sounds normal. No respiratory distress. She has no wheezes.  Abdominal: Soft. Bowel sounds are normal.  Lymphadenopathy:    She has no cervical adenopathy.  Skin: Skin is warm and dry.       Assessment & Plan:   Acute non-recurrent maxillary sinusitis -     predniSONE (DELTASONE) 20 MG tablet; 2 tablets daily for 3 days, 1 tablet daily for 4 days. -     azithromycin (ZITHROMAX) 250 MG tablet; Take 2 tablets (500 mg) on  Day 1,  followed by 1 tablet (250 mg) once daily on Days 2 through 5.

## 2017-07-01 NOTE — Patient Instructions (Signed)
Make sure you are on an allergy pill, see below for more details. Please take the prednisone as directed below, this is NOT an antibiotic so you do NOT have to finish it. You can take it for a few days and stop it if you are doing better.   Please take the prednisone to help decrease inflammation and therefore decrease symptoms. Take it it with food to avoid GI upset. It can cause increased energy but on the other hand it can make it hard to sleep at night so please take it AT NIGHT WITH DINNER, it takes 8-12 hours to start working so it will NOT affect your sleeping if you take it at night with your food!!  If you are diabetic it will increase your sugars so decrease carbs and monitor your sugars closely.     HOW TO TREAT VIRAL COUGH AND COLD SYMPTOMS:  -Symptoms usually last at least 1 week with the worst symptoms being around day 4.  - colds usually start with a sore throat and end with a cough, and the cough can take 2 weeks to get better.  -No antibiotics are needed for colds, flu, sore throats, cough, bronchitis UNLESS symptoms are longer than 7 days OR if you are getting better then get drastically worse.  -There are a lot of combination medications (Dayquil, Nyquil, Vicks 44, tyelnol cold and sinus, ETC). Please look at the ingredients on the back so that you are treating the correct symptoms and not doubling up on medications/ingredients.    Medicines you can use  Nasal congestion  Little Remedies saline spray (aerosol/mist)- can try this, it is in the kids section - pseudoephedrine (Sudafed)- behind the counter, do not use if you have high blood pressure, medicine that have -D in them.  - phenylephrine (Sudafed PE) -Dextormethorphan + chlorpheniramine (Coridcidin HBP)- okay if you have high blood pressure -Oxymetazoline (Afrin) nasal spray- LIMIT to 3 days -Saline nasal spray -Neti pot (used distilled or bottled water)  Ear pain/congestion  -pseudoephedrine (sudafed) -  Nasonex/flonase nasal spray  Fever  -Acetaminophen (Tyelnol) -Ibuprofen (Advil, motrin, aleve)  Sore Throat  -Acetaminophen (Tyelnol) -Ibuprofen (Advil, motrin, aleve) -Drink a lot of water -Gargle with salt water - Rest your voice (don't talk) -Throat sprays -Cough drops  Body Aches  -Acetaminophen (Tyelnol) -Ibuprofen (Advil, motrin, aleve)  Headache  -Acetaminophen (Tyelnol) -Ibuprofen (Advil, motrin, aleve) - Exedrin, Exedrin Migraine  Allergy symptoms (cough, sneeze, runny nose, itchy eyes) -Claritin or loratadine cheapest but likely the weakest  -Zyrtec or certizine at night because it can make you sleepy -The strongest is allegra or fexafinadine  Cheapest at [employer], [employer], [employer]  Cough  -Dextromethorphan (Delsym)- medicine that has DM in it -Guafenesin (Mucinex/Robitussin) - cough drops - drink lots of water  Chest Congestion  -Guafenesin (Mucinex/Robitussin)  Red Itchy Eyes  - Naphcon-A  Upset Stomach  - Bland diet (nothing spicy, greasy, fried, and high acid foods like tomatoes, oranges, berries) -OKAY- cereal, bread, soup, crackers, rice -Eat smaller more frequent meals -reduce caffeine, no alcohol -Loperamide (Imodium-AD) if diarrhea -Prevacid for heart burn  General health when sick  -Hydration -wash your hands frequently -keep surfaces clean -change pillow cases and sheets often -Get fresh air but do not exercise strenuously -Vitamin D, double up on it - Vitamin C -Zinc       

## 2017-07-02 ENCOUNTER — Encounter: Payer: Self-pay | Admitting: Obstetrics & Gynecology

## 2017-07-16 ENCOUNTER — Encounter: Payer: Self-pay | Admitting: Obstetrics & Gynecology

## 2017-07-16 ENCOUNTER — Ambulatory Visit (INDEPENDENT_AMBULATORY_CARE_PROVIDER_SITE_OTHER): Payer: BLUE CROSS/BLUE SHIELD | Admitting: Obstetrics & Gynecology

## 2017-07-16 VITALS — BP 128/82 | Ht 65.5 in | Wt 137.6 lb

## 2017-07-16 DIAGNOSIS — Z01419 Encounter for gynecological examination (general) (routine) without abnormal findings: Secondary | ICD-10-CM | POA: Diagnosis not present

## 2017-07-16 DIAGNOSIS — Z13820 Encounter for screening for osteoporosis: Secondary | ICD-10-CM

## 2017-07-16 DIAGNOSIS — Z780 Asymptomatic menopausal state: Secondary | ICD-10-CM

## 2017-07-16 DIAGNOSIS — Z113 Encounter for screening for infections with a predominantly sexual mode of transmission: Secondary | ICD-10-CM

## 2017-07-16 DIAGNOSIS — Z1151 Encounter for screening for human papillomavirus (HPV): Secondary | ICD-10-CM

## 2017-07-16 MED ORDER — ESTRADIOL 0.05 MG/24HR TD PTWK: 0.0500 mg | MEDICATED_PATCH | TRANSDERMAL | 4 refills | Status: DC

## 2017-07-16 MED ORDER — PROGESTERONE MICRONIZED 100 MG PO CAPS: 100.0000 mg | ORAL_CAPSULE | Freq: Every day | ORAL | 4 refills | Status: DC

## 2017-07-16 NOTE — Progress Notes (Signed)
Erica [name] 07-Feb-1966 [id number]   History:    52 y.o. G0 Single  RP:  Established patient presenting for annual gyn exam   HPI: Menopause, well on hormone replacement therapy with the estradiol patch 0.05 and the Prometrium 100 mg at bedtime.  No postmenopausal bleeding.  No pelvic pain.  Not currently sexually active.  Urine and bowel movements normal.  Breasts normal.  Body mass index 22.55.  Physically active.  Health labs with family physician.  Last colonoscopy in 2016.  Past medical history,surgical history, family history and social history were all reviewed and documented in the EPIC chart.  Gynecologic History No LMP recorded. (Menstrual status: Perimenopausal). Contraception: post menopausal status Last Pap: 11/2014. Results were: Negative Last mammogram: 06/2017. Results were: Pending results, per patient normal Bone Density: Never, will schedule now here Colonoscopy: 2016  Obstetric History OB History  Gravida Para Term Preterm AB Living  0            SAB TAB Ectopic Multiple Live Births                ROS: A ROS was performed and pertinent positives and negatives are included in the history.  GENERAL: No fevers or chills. HEENT: No change in vision, no earache, sore throat or sinus congestion. NECK: No pain or stiffness. CARDIOVASCULAR: No chest pain or pressure. No palpitations. PULMONARY: No shortness of breath, cough or wheeze. GASTROINTESTINAL: No abdominal pain, nausea, vomiting or diarrhea, melena or bright red blood per rectum. GENITOURINARY: No urinary frequency, urgency, hesitancy or dysuria. MUSCULOSKELETAL: No joint or muscle pain, no back pain, no recent trauma. DERMATOLOGIC: No rash, no itching, no lesions. ENDOCRINE: No polyuria, polydipsia, no heat or cold intolerance. No recent change in weight. HEMATOLOGICAL: No anemia or easy bruising or bleeding. NEUROLOGIC: No headache, seizures, numbness, tingling or weakness. PSYCHIATRIC: No depression, no loss  of interest in normal activity or change in sleep pattern.     Exam:   BP 128/82   Ht 5' 5.5" (1.664 m)   Wt 137 lb 9.6 oz (62.4 kg)   BMI 22.55 kg/m   Body mass index is 22.55 kg/m.  General appearance : Well developed well nourished female. No acute distress HEENT: Eyes: no retinal hemorrhage or exudates,  Neck supple, trachea midline, no carotid bruits, no thyroidmegaly Lungs: Clear to auscultation, no rhonchi or wheezes, or rib retractions  Heart: Regular rate and rhythm, no murmurs or gallops Breast:Examined in sitting and supine position were symmetrical in appearance, no palpable masses or tenderness,  no skin retraction, no nipple inversion, no nipple discharge, no skin discoloration, no axillary or supraclavicular lymphadenopathy Abdomen: no palpable masses or tenderness, no rebound or guarding Extremities: no edema or skin discoloration or tenderness  Pelvic: Vulva: Normal             Vagina: No gross lesions or discharge  Cervix: No gross lesions or discharge.  Pap/HR HPV, Gono-Chlam done  Uterus  RV, normal size, shape and consistency, non-tender and mobile  Adnexa  Without masses or tenderness  Anus: Normal   Assessment/Plan:  52 y.o. female for annual exam   1. Encounter for routine gynecological examination with Papanicolaou smear of cervix  Normal gynecologic exam in menopause.  Pap with high-risk HPV done.  Breast exam normal.  Recent screening mammogram March 2019 pending.  Colonoscopy 2016.  Health labs with family physician.  2. Menopause present Well on hormone replacement therapy.  Patient was very symptomatic before starting on  hormone replacement therapy.  No postmenopausal bleeding.  No contraindication, although mother had breast cancer after menopause.  Patient understands the risks and benefits of hormone replacement therapy and wishes to continue.  Prescription sent to pharmacy.  3. Screen for STD (sexually transmitted disease) Gonorrhea and  Chlamydia done on the Pap test  4. Screening for osteoporosis Vitamin D supplements, calcium rich nutrition and regular weightbearing physical activity recommended.  Patient will follow-up here for bone density. - DG Bone Density; Future  Other orders - estradiol (CLIMARA - DOSED IN MG/24 HR) 0.05 mg/24hr patch; Place 1 patch (0.05 mg total) onto the skin once a week. - progesterone (PROMETRIUM) 100 MG capsule; Take 1 capsule (100 mg total) by mouth at bedtime.  Princess Bruins MD, 11:46 AM 07/16/2017

## 2017-07-17 ENCOUNTER — Other Ambulatory Visit: Payer: Self-pay | Admitting: Gynecology

## 2017-07-17 DIAGNOSIS — Z13820 Encounter for screening for osteoporosis: Secondary | ICD-10-CM

## 2017-07-18 LAB — PAP IG, CT-NG NAA, HPV HIGH-RISK
C. trachomatis RNA, TMA: NOT DETECTED
HPV DNA HIGH RISK: NOT DETECTED
N. GONORRHOEAE RNA, TMA: NOT DETECTED

## 2017-07-19 ENCOUNTER — Encounter: Payer: Self-pay | Admitting: Obstetrics & Gynecology

## 2017-07-19 NOTE — Patient Instructions (Signed)
1. Encounter for routine gynecological examination with Papanicolaou smear of cervix  Normal gynecologic exam in menopause.  Pap with high-risk HPV done.  Breast exam normal.  Recent screening mammogram March 2019 pending.  Colonoscopy 2016.  Health labs with family physician.  2. Menopause present Well on hormone replacement therapy.  Patient was very symptomatic before starting on hormone replacement therapy.  No postmenopausal bleeding.  No contraindication, although mother had breast cancer after menopause.  Patient understands the risks and benefits of hormone replacement therapy and wishes to continue.  Prescription sent to pharmacy.  3. Screen for STD (sexually transmitted disease) Gonorrhea and Chlamydia done on the Pap test  4. Screening for osteoporosis Vitamin D supplements, calcium rich nutrition and regular weightbearing physical activity recommended.  Patient will follow-up here for bone density. - DG Bone Density; Future  Other orders - estradiol (CLIMARA - DOSED IN MG/24 HR) 0.05 mg/24hr patch; Place 1 patch (0.05 mg total) onto the skin once a week. - progesterone (PROMETRIUM) 100 MG capsule; Take 1 capsule (100 mg total) by mouth at bedtime.  Erica [name], it was a pleasure meeting you today!  I will inform you of your results as soon as they are available.   Health Maintenance for Postmenopausal Women Menopause is a normal process in which your reproductive ability comes to an end. This process happens gradually over a span of months to years, usually between the ages of 39 and 36. Menopause is complete when you have missed 12 consecutive menstrual periods. It is important to talk with your health care provider about some of the most common conditions that affect postmenopausal women, such as heart disease, cancer, and bone loss (osteoporosis). Adopting a healthy lifestyle and getting preventive care can help to promote your health and wellness. Those actions can also lower your  chances of developing some of these common conditions. What should I know about menopause? During menopause, you may experience a number of symptoms, such as:  Moderate-to-severe hot flashes.  Night sweats.  Decrease in sex drive.  Mood swings.  Headaches.  Tiredness.  Irritability.  Memory problems.  Insomnia.  Choosing to treat or not to treat menopausal changes is an individual decision that you make with your health care provider. What should I know about hormone replacement therapy and supplements? Hormone therapy products are effective for treating symptoms that are associated with menopause, such as hot flashes and night sweats. Hormone replacement carries certain risks, especially as you become older. If you are thinking about using estrogen or estrogen with progestin treatments, discuss the benefits and risks with your health care provider. What should I know about heart disease and stroke? Heart disease, heart attack, and stroke become more likely as you age. This may be due, in part, to the hormonal changes that your body experiences during menopause. These can affect how your body processes dietary fats, triglycerides, and cholesterol. Heart attack and stroke are both medical emergencies. There are many things that you can do to help prevent heart disease and stroke:  Have your blood pressure checked at least every 1-2 years. High blood pressure causes heart disease and increases the risk of stroke.  If you are 28-31 years old, ask your health care provider if you should take aspirin to prevent a heart attack or a stroke.  Do not use any tobacco products, including cigarettes, chewing tobacco, or electronic cigarettes. If you need help quitting, ask your health care provider.  It is important to eat a healthy diet  and maintain a healthy weight. ? Be sure to include plenty of vegetables, fruits, low-fat dairy products, and lean protein. ? Avoid eating foods that are  high in solid fats, added sugars, or salt (sodium).  Get regular exercise. This is one of the most important things that you can do for your health. ? Try to exercise for at least 150 minutes each week. The type of exercise that you do should increase your heart rate and make you sweat. This is known as moderate-intensity exercise. ? Try to do strengthening exercises at least twice each week. Do these in addition to the moderate-intensity exercise.  Know your numbers.Ask your health care provider to check your cholesterol and your blood glucose. Continue to have your blood tested as directed by your health care provider.  What should I know about cancer screening? There are several types of cancer. Take the following steps to reduce your risk and to catch any cancer development as early as possible. Breast Cancer  Practice breast self-awareness. ? This means understanding how your breasts normally appear and feel. ? It also means doing regular breast self-exams. Let your health care provider know about any changes, no matter how small.  If you are 10 or older, have a clinician do a breast exam (clinical breast exam or CBE) every year. Depending on your age, family history, and medical history, it may be recommended that you also have a yearly breast X-ray (mammogram).  If you have a family history of breast cancer, talk with your health care provider about genetic screening.  If you are at high risk for breast cancer, talk with your health care provider about having an MRI and a mammogram every year.  Breast cancer (BRCA) gene test is recommended for women who have family members with BRCA-related cancers. Results of the assessment will determine the need for genetic counseling and BRCA1 and for BRCA2 testing. BRCA-related cancers include these types: ? Breast. This occurs in males or females. ? Ovarian. ? Tubal. This may also be called fallopian tube cancer. ? Cancer of the abdominal or  pelvic lining (peritoneal cancer). ? Prostate. ? Pancreatic.  Cervical, Uterine, and Ovarian Cancer Your health care provider may recommend that you be screened regularly for cancer of the pelvic organs. These include your ovaries, uterus, and vagina. This screening involves a pelvic exam, which includes checking for microscopic changes to the surface of your cervix (Pap test).  For women ages 21-65, health care providers may recommend a pelvic exam and a Pap test every three years. For women ages 54-65, they may recommend the Pap test and pelvic exam, combined with testing for human papilloma virus (HPV), every five years. Some types of HPV increase your risk of cervical cancer. Testing for HPV may also be done on women of any age who have unclear Pap test results.  Other health care providers may not recommend any screening for nonpregnant women who are considered low risk for pelvic cancer and have no symptoms. Ask your health care provider if a screening pelvic exam is right for you.  If you have had past treatment for cervical cancer or a condition that could lead to cancer, you need Pap tests and screening for cancer for at least 20 years after your treatment. If Pap tests have been discontinued for you, your risk factors (such as having a new sexual partner) need to be reassessed to determine if you should start having screenings again. Some women have medical problems that increase  the chance of getting cervical cancer. In these cases, your health care provider may recommend that you have screening and Pap tests more often.  If you have a family history of uterine cancer or ovarian cancer, talk with your health care provider about genetic screening.  If you have vaginal bleeding after reaching menopause, tell your health care provider.  There are currently no reliable tests available to screen for ovarian cancer.  Lung Cancer Lung cancer screening is recommended for adults 75-60 years old  who are at high risk for lung cancer because of a history of smoking. A yearly low-dose CT scan of the lungs is recommended if you:  Currently smoke.  Have a history of at least 30 pack-years of smoking and you currently smoke or have quit within the past 15 years. A pack-year is smoking an average of one pack of cigarettes per day for one year.  Yearly screening should:  Continue until it has been 15 years since you quit.  Stop if you develop a health problem that would prevent you from having lung cancer treatment.  Colorectal Cancer  This type of cancer can be detected and can often be prevented.  Routine colorectal cancer screening usually begins at age 86 and continues through age 55.  If you have risk factors for colon cancer, your health care provider may recommend that you be screened at an earlier age.  If you have a family history of colorectal cancer, talk with your health care provider about genetic screening.  Your health care provider may also recommend using home test kits to check for hidden blood in your stool.  A small camera at the end of a tube can be used to examine your colon directly (sigmoidoscopy or colonoscopy). This is done to check for the earliest forms of colorectal cancer.  Direct examination of the colon should be repeated every 5-10 years until age 70. However, if early forms of precancerous polyps or small growths are found or if you have a family history or genetic risk for colorectal cancer, you may need to be screened more often.  Skin Cancer  Check your skin from head to toe regularly.  Monitor any moles. Be sure to tell your health care provider: ? About any new moles or changes in moles, especially if there is a change in a mole's shape or color. ? If you have a mole that is larger than the size of a pencil eraser.  If any of your family members has a history of skin cancer, especially at a young age, talk with your health care provider  about genetic screening.  Always use sunscreen. Apply sunscreen liberally and repeatedly throughout the day.  Whenever you are outside, protect yourself by wearing long sleeves, pants, a wide-brimmed hat, and sunglasses.  What should I know about osteoporosis? Osteoporosis is a condition in which bone destruction happens more quickly than new bone creation. After menopause, you may be at an increased risk for osteoporosis. To help prevent osteoporosis or the bone fractures that can happen because of osteoporosis, the following is recommended:  If you are 55-21 years old, get at least 1,000 mg of calcium and at least 600 mg of vitamin D per day.  If you are older than age 7 but younger than age 54, get at least 1,200 mg of calcium and at least 600 mg of vitamin D per day.  If you are older than age 32, get at least 1,200 mg of calcium and  at least 800 mg of vitamin D per day.  Smoking and excessive alcohol intake increase the risk of osteoporosis. Eat foods that are rich in calcium and vitamin D, and do weight-bearing exercises several times each week as directed by your health care provider. What should I know about how menopause affects my mental health? Depression may occur at any age, but it is more common as you become older. Common symptoms of depression include:  Low or sad mood.  Changes in sleep patterns.  Changes in appetite or eating patterns.  Feeling an overall lack of motivation or enjoyment of activities that you previously enjoyed.  Frequent crying spells.  Talk with your health care provider if you think that you are experiencing depression. What should I know about immunizations? It is important that you get and maintain your immunizations. These include:  Tetanus, diphtheria, and pertussis (Tdap) booster vaccine.  Influenza every year before the flu season begins.  Pneumonia vaccine.  Shingles vaccine.  Your health care provider may also recommend other  immunizations. This information is not intended to replace advice given to you by your health care provider. Make sure you discuss any questions you have with your health care provider. Document Released: 06/01/2005 Document Revised: 10/28/2015 Document Reviewed: 01/11/2015 Elsevier Interactive Patient Education  2018 [employer].

## 2017-07-22 DIAGNOSIS — M8580 Other specified disorders of bone density and structure, unspecified site: Secondary | ICD-10-CM

## 2017-07-22 HISTORY — DX: Other specified disorders of bone density and structure, unspecified site: M85.80

## 2017-07-23 ENCOUNTER — Ambulatory Visit (INDEPENDENT_AMBULATORY_CARE_PROVIDER_SITE_OTHER): Payer: BLUE CROSS/BLUE SHIELD | Admitting: Physician Assistant

## 2017-07-23 ENCOUNTER — Encounter: Payer: Self-pay | Admitting: Physician Assistant

## 2017-07-23 ENCOUNTER — Ambulatory Visit (INDEPENDENT_AMBULATORY_CARE_PROVIDER_SITE_OTHER): Payer: BLUE CROSS/BLUE SHIELD

## 2017-07-23 ENCOUNTER — Encounter: Payer: Self-pay | Admitting: Gynecology

## 2017-07-23 ENCOUNTER — Other Ambulatory Visit: Payer: Self-pay | Admitting: Gynecology

## 2017-07-23 VITALS — BP 124/78 | HR 78 | Temp 97.5°F | Resp 16 | Ht 65.5 in | Wt 140.4 lb

## 2017-07-23 DIAGNOSIS — M8589 Other specified disorders of bone density and structure, multiple sites: Secondary | ICD-10-CM

## 2017-07-23 DIAGNOSIS — J0100 Acute maxillary sinusitis, unspecified: Secondary | ICD-10-CM

## 2017-07-23 DIAGNOSIS — Z13820 Encounter for screening for osteoporosis: Secondary | ICD-10-CM

## 2017-07-23 MED ORDER — MOMETASONE FUROATE 50 MCG/ACT NA SUSP
2.0000 | Freq: Every day | NASAL | 2 refills | Status: DC
Start: 2017-07-23 — End: 2018-04-17

## 2017-07-23 NOTE — Progress Notes (Signed)
Subjective:    Patient ID: Erica [name], female    DOB: [birth year], 52 y.o.   MRN: [id number]  HPI 52 y.o. WF presents for cold symptoms. She was seen 07/01/2017 for cold, given prednisone and zpak x 2 at that time and does not feel better.  She has congestion, sinus pressure, feeling fatigued spent whole day on sofa on Saturday, no teeth pain, no fever, chills. She got back on tylenol and flu medicine and still on xyzal.   She had an elevated calcium at 10.5 last OV with vitamin D def, suppose to come back 3 months for labs we will get those today. She has not been on any calcium supplements, she had DEXA today through GYN Dr. Dellis Filbert.   Blood pressure 124/78, pulse 78, temperature (!) 97.5 F (36.4 C), resp. rate 16, height 5' 5.5" (1.664 m), weight 140 lb 6.4 oz (63.7 kg), SpO2 95 %.  Medications Current Outpatient Medications on File Prior to Visit  Medication Sig  . Calcium Carbonate-Vitamin D (CALCIUM + D PO) Take by mouth.  . Cholecalciferol (VITAMIN D PO) Take by mouth.  . Coenzyme Q10 (CO Q 10 PO) Take by mouth.  . Cyanocobalamin (VITAMIN B-12 PO) Take by mouth.  . EPINEPHrine 0.3 mg/0.3 mL IJ SOAJ injection Inject 0.3 mg into the muscle.  . estradiol (CLIMARA - DOSED IN MG/24 HR) 0.05 mg/24hr patch Place 1 patch (0.05 mg total) onto the skin once a week.  . fenofibrate (TRICOR) 145 MG tablet TAKE 1 TABLET(145 MG) BY MOUTH DAILY  . levocetirizine (XYZAL) 5 MG tablet Take 1 tablet (5 mg total) by mouth every evening.  . Multiple Vitamin (THERA) TABS Take 1 tablet by mouth.  . norethindrone-ethinyl estradiol (MICROGESTIN FE 1/20) 1-20 MG-MCG tablet Take 1 tablet by mouth daily.  . Omega-3 Fatty Acids (OMEGA-3 EPA FISH OIL PO) Take by mouth daily.  . progesterone (PROMETRIUM) 100 MG capsule Take 1 capsule (100 mg total) by mouth at bedtime.  Marland Kitchen spironolactone (ALDACTONE) 100 MG tablet Take 1.5 tablets (150 mg total) by mouth daily.   Current Facility-Administered Medications on  File Prior to Visit  Medication  . dexamethasone (DECADRON) injection 20 mg    Problem list She has Fibroid; Environmental allergies; Hyperlipidemia; Vitamin D deficiency; Acne; Allergic to food; and Low back pain on their problem list.  Review of Systems See HPI    Objective:   Physical Exam  Constitutional: She appears well-developed and well-nourished.  HENT:  Head: Normocephalic and atraumatic.  Right Ear: External ear normal.  Nose: Right sinus exhibits no maxillary sinus tenderness and no frontal sinus tenderness. Left sinus exhibits no maxillary sinus tenderness and no frontal sinus tenderness.  Eyes: Conjunctivae and EOM are normal.  Neck: Normal range of motion. Neck supple.  Cardiovascular: Normal rate, regular rhythm, normal heart sounds and intact distal pulses.  Pulmonary/Chest: Effort normal and breath sounds normal. No respiratory distress. She has no wheezes.  Abdominal: Soft. Bowel sounds are normal.  Lymphadenopathy:    She has no cervical adenopathy.  Skin: Skin is warm and dry.       Assessment & Plan:   Acute non-recurrent maxillary sinusitis No fever, chills, normal exam Get on flonase, pending labs can send in ABX Continue allergy pill  Serum calcium elevated Check the PTH, calcium, Vitamin D Has been on 5000 IU daily Off any tums/calcium supplements  Iron excess Will recheck iron/ferritin    Future Appointments  Date Time Provider [place]  05/20/2018  9:00 AM Vicie Mutters, PA-C [facility] None  07/21/2018  8:30 AM Princess Bruins, MD [facility]

## 2017-07-23 NOTE — Patient Instructions (Addendum)
Flonase or nasonex.  Can do a steroid nasal sprayy 1-2 sparys at night each nostril. Remember to spray each nostril twice towards the outer part of your eye.  Do not sniff but instead pinch your nose and tilt your head back to help the medicine get into your sinuses.  The best time to do this is at bedtime. Stop if you get blurred vision or nose bleeds.     Try just the inhaler, allergy pill and prednisone for a few days, you always want to give your body 10 days to fight off infection with help before taking an anabiotic.  Antibiotics have been linked with colon infections, resistance and newest theory is colon cancer in 40-50 year olds. So it is VERY important to try to avoid them, antibiotics are NOT risk free medications.  If you are not feeling better make an office visit.  Here is more info below HOW TO TREAT VIRAL COUGH AND COLD SYMPTOMS:  -Symptoms usually last at least 1 week with the worst symptoms being around day 4.  - colds usually start with a sore throat and end with a cough, and the cough can take 2 weeks to get better.  -No antibiotics are needed for colds, flu, sore throats, cough, bronchitis UNLESS symptoms are longer than 7 days OR if you are getting better then get drastically worse.  -There are a lot of combination medications (Dayquil, Nyquil, Vicks 44, tyelnol cold and sinus, ETC). Please look at the ingredients on the back so that you are treating the correct symptoms and not doubling up on medications/ingredients.    Medicines you can use  Nasal congestion  Little Remedies saline spray (aerosol/mist)- can try this, it is in the kids section - pseudoephedrine (Sudafed)- behind the counter, do not use if you have high blood pressure, medicine that have -D in them.  - phenylephrine (Sudafed PE) -Dextormethorphan + chlorpheniramine (Coridcidin HBP)- okay if you have high blood pressure -Oxymetazoline (Afrin) nasal spray- LIMIT to 3 days -Saline nasal spray -Neti pot  (used distilled or bottled water)  Ear pain/congestion  -pseudoephedrine (sudafed) - Nasonex/flonase nasal spray  Fever  -Acetaminophen (Tyelnol) -Ibuprofen (Advil, motrin, aleve)  Sore Throat  -Acetaminophen (Tyelnol) -Ibuprofen (Advil, motrin, aleve) -Drink a lot of water -Gargle with salt water - Rest your voice (don't talk) -Throat sprays -Cough drops  Body Aches  -Acetaminophen (Tyelnol) -Ibuprofen (Advil, motrin, aleve)  Headache  -Acetaminophen (Tyelnol) -Ibuprofen (Advil, motrin, aleve) - Exedrin, Exedrin Migraine  Allergy symptoms (cough, sneeze, runny nose, itchy eyes) -Claritin or loratadine cheapest but likely the weakest  -Zyrtec or certizine at night because it can make you sleepy -The strongest is allegra or fexafinadine  Cheapest at [employer], [employer], [employer]  Cough  -Dextromethorphan (Delsym)- medicine that has DM in it -Guafenesin (Mucinex/Robitussin) - cough drops - drink lots of water  Chest Congestion  -Guafenesin (Mucinex/Robitussin)  Red Itchy Eyes  - Naphcon-A  Upset Stomach  - Bland diet (nothing spicy, greasy, fried, and high acid foods like tomatoes, oranges, berries) -OKAY- cereal, bread, soup, crackers, rice -Eat smaller more frequent meals -reduce caffeine, no alcohol -Loperamide (Imodium-AD) if diarrhea -Prevacid for heart burn  General health when sick  -Hydration -wash your hands frequently -keep surfaces clean -change pillow cases and sheets often -Get fresh air but do not exercise strenuously -Vitamin D, double up on it - Vitamin C -Zinc

## 2017-07-24 LAB — CBC WITH DIFFERENTIAL/PLATELET
BASOS PCT: 0.9 %
Basophils Absolute: 52 cells/uL (ref 0–200)
Eosinophils Absolute: 238 cells/uL (ref 15–500)
Eosinophils Relative: 4.1 %
HCT: 39.8 % (ref 35.0–45.0)
Hemoglobin: 13.9 g/dL (ref 11.7–15.5)
Lymphs Abs: 1868 cells/uL (ref 850–3900)
MCH: 32 pg (ref 27.0–33.0)
MCHC: 34.9 g/dL (ref 32.0–36.0)
MCV: 91.5 fL (ref 80.0–100.0)
MPV: 10.3 fL (ref 7.5–12.5)
Monocytes Relative: 8 %
Neutro Abs: 3178 cells/uL (ref 1500–7800)
Neutrophils Relative %: 54.8 %
PLATELETS: 399 10*3/uL (ref 140–400)
RBC: 4.35 10*6/uL (ref 3.80–5.10)
RDW: 12 % (ref 11.0–15.0)
TOTAL LYMPHOCYTE: 32.2 %
WBC: 5.8 10*3/uL (ref 3.8–10.8)
WBCMIX: 464 {cells}/uL (ref 200–950)

## 2017-07-24 LAB — PTH, INTACT AND CALCIUM
Calcium: 10.1 mg/dL (ref 8.6–10.4)
PTH: 11 pg/mL — ABNORMAL LOW (ref 14–64)

## 2017-07-24 LAB — IRON, TOTAL/TOTAL IRON BINDING CAP
%SAT: 20 % (ref 11–50)
IRON: 117 ug/dL (ref 45–160)
TIBC: 575 ug/dL — AB (ref 250–450)

## 2017-07-24 LAB — VITAMIN D 25 HYDROXY (VIT D DEFICIENCY, FRACTURES): Vit D, 25-Hydroxy: 57 ng/mL (ref 30–100)

## 2017-07-24 LAB — FERRITIN: Ferritin: 103 ng/mL (ref 10–232)

## 2017-07-24 MED ORDER — PREDNISONE 20 MG PO TABS: ORAL_TABLET | ORAL | 0 refills | Status: DC

## 2017-07-24 NOTE — Addendum Note (Signed)
Addended by: Vicie Mutters R on: 07/24/2017 05:40 PM   Modules accepted: Orders

## 2017-10-01 ENCOUNTER — Encounter: Payer: BLUE CROSS/BLUE SHIELD | Admitting: Obstetrics & Gynecology

## 2018-04-17 ENCOUNTER — Ambulatory Visit (INDEPENDENT_AMBULATORY_CARE_PROVIDER_SITE_OTHER): Payer: BLUE CROSS/BLUE SHIELD | Admitting: Physician Assistant

## 2018-04-17 ENCOUNTER — Encounter: Payer: Self-pay | Admitting: Physician Assistant

## 2018-04-17 VITALS — BP 120/76 | HR 88 | Temp 97.7°F | Ht 65.5 in | Wt 146.4 lb

## 2018-04-17 DIAGNOSIS — Z9109 Other allergy status, other than to drugs and biological substances: Secondary | ICD-10-CM | POA: Diagnosis not present

## 2018-04-17 DIAGNOSIS — E785 Hyperlipidemia, unspecified: Secondary | ICD-10-CM

## 2018-04-17 DIAGNOSIS — R350 Frequency of micturition: Secondary | ICD-10-CM

## 2018-04-17 DIAGNOSIS — L709 Acne, unspecified: Secondary | ICD-10-CM | POA: Diagnosis not present

## 2018-04-17 MED ORDER — SPIRONOLACTONE 100 MG PO TABS: 150.0000 mg | ORAL_TABLET | Freq: Every day | ORAL | 3 refills | Status: DC

## 2018-04-17 MED ORDER — FENOFIBRATE 145 MG PO TABS: ORAL_TABLET | ORAL | 3 refills | Status: DC

## 2018-04-17 MED ORDER — LEVOCETIRIZINE DIHYDROCHLORIDE 5 MG PO TABS: 5.0000 mg | ORAL_TABLET | Freq: Every evening | ORAL | 3 refills | Status: DC

## 2018-04-17 NOTE — Patient Instructions (Signed)

## 2018-04-17 NOTE — Progress Notes (Signed)
Subjective:    Patient ID: Erica [name], female    DOB: 04-12-1966, 52 y.o.   MRN: [id number]  HPI 52 y.o. WF presents with abnormal pelvic discomfort.  She states she has had urinary discomfort. If she has to hold her urine at all she has suprapubic pain. She states that during being in salt water, she had burning. She has had some frequency with urinating. Denies vaginal dryness, discharge. No back pain, no fever, chills.   Has history of urinary infection without symptoms, has had pyelonephritis in the past.   She follows with Dr. Dellis Filbert, she is on estrogen patches and progesterone. She is struggling with the patches.    Blood pressure 120/76, pulse 88, temperature 97.7 F (36.5 C), height 5' 5.5" (1.664 m), weight 146 lb 6.4 oz (66.4 kg), SpO2 98 %.  Medications Current Outpatient Medications on File Prior to Visit  Medication Sig  . Calcium Carbonate-Vitamin D (CALCIUM + D PO) Take by mouth.  . Cholecalciferol (VITAMIN D PO) Take by mouth.  . Coenzyme Q10 (CO Q 10 PO) Take by mouth.  . Cyanocobalamin (VITAMIN B-12 PO) Take by mouth.  . EPINEPHrine 0.3 mg/0.3 mL IJ SOAJ injection Inject 0.3 mg into the muscle.  . estradiol (CLIMARA - DOSED IN MG/24 HR) 0.05 mg/24hr patch Place 1 patch (0.05 mg total) onto the skin once a week.  . fenofibrate (TRICOR) 145 MG tablet TAKE 1 TABLET(145 MG) BY MOUTH DAILY  . levocetirizine (XYZAL) 5 MG tablet Take 1 tablet (5 mg total) by mouth every evening.  . Multiple Vitamin (THERA) TABS Take 1 tablet by mouth.  . progesterone (PROMETRIUM) 100 MG capsule Take 1 capsule (100 mg total) by mouth at bedtime.  Marland Kitchen spironolactone (ALDACTONE) 100 MG tablet Take 1.5 tablets (150 mg total) by mouth daily.  . mometasone (NASONEX) 50 MCG/ACT nasal spray Place 2 sprays into the nose daily. (Patient not taking: Reported on 04/17/2018)  . Omega-3 Fatty Acids (OMEGA-3 EPA FISH OIL PO) Take by mouth daily.   Current Facility-Administered Medications on File  Prior to Visit  Medication  . dexamethasone (DECADRON) injection 20 mg    Problem list She has Fibroid; Environmental allergies; Hyperlipidemia; Vitamin D deficiency; Acne; Allergic to food; and Low back pain on their problem list.   Review of Systems  Constitutional: Negative for chills.  HENT: Negative.   Respiratory: Negative.   Cardiovascular: Negative.   Gastrointestinal: Negative.  Negative for nausea and vomiting.  Genitourinary: Positive for dysuria and frequency. Negative for decreased urine volume, difficulty urinating, dyspareunia, enuresis, flank pain, genital sores, hematuria, menstrual problem, pelvic pain, urgency, vaginal bleeding and vaginal discharge.       Objective:   Physical Exam Constitutional:      Appearance: She is well-developed.  HENT:     Head: Normocephalic and atraumatic.     Right Ear: External ear normal.     Left Ear: External ear normal.  Eyes:     Conjunctiva/sclera: Conjunctivae normal.     Pupils: Pupils are equal, round, and reactive to light.  Neck:     Musculoskeletal: Normal range of motion and neck supple.     Thyroid: No thyromegaly.  Cardiovascular:     Rate and Rhythm: Normal rate and regular rhythm.     Heart sounds: Normal heart sounds. No murmur. No friction rub. No gallop.   Pulmonary:     Effort: Pulmonary effort is normal. No respiratory distress.     Breath sounds: Normal breath sounds.  No wheezing.  Abdominal:     General: Bowel sounds are normal. There is no distension.     Palpations: Abdomen is soft. There is no mass.     Tenderness: There is no abdominal tenderness. There is no guarding or rebound.  Musculoskeletal: Normal range of motion.  Lymphadenopathy:     Cervical: No cervical adenopathy.  Skin:    General: Skin is warm and dry.  Neurological:     Mental Status: She is alert and oriented to person, place, and time.     Cranial Nerves: No cranial nerve deficit.     Coordination: Coordination normal.      Deep Tendon Reflexes: Reflexes normal.        Assessment & Plan:    Urinary frequency -     Urinalysis, Routine w reflex microscopic -     Urine Culture - check urine, will send in script tomorrow pending urine  Environmental allergies -     levocetirizine (XYZAL) 5 MG tablet; Take 1 tablet (5 mg total) by mouth every evening.  Hyperlipidemia, unspecified hyperlipidemia type -     fenofibrate (TRICOR) 145 MG tablet; TAKE 1 TABLET(145 MG) BY MOUTH DAILY  Acne, unspecified acne type -     spironolactone (ALDACTONE) 100 MG tablet; Take 1.5 tablets (150 mg total) by mouth daily.    Future Appointments  Date Time Provider [place]  05/20/2018  9:00 AM Vicie Mutters, PA-C [facility] None  07/21/2018  8:30 AM Princess Bruins, MD [facility]

## 2018-04-18 ENCOUNTER — Other Ambulatory Visit: Payer: Self-pay | Admitting: Physician Assistant

## 2018-04-18 DIAGNOSIS — Z9109 Other allergy status, other than to drugs and biological substances: Secondary | ICD-10-CM

## 2018-04-18 MED ORDER — SULFAMETHOXAZOLE-TRIMETHOPRIM 800-160 MG PO TABS: 1.0000 | ORAL_TABLET | Freq: Two times a day (BID) | ORAL | 0 refills | Status: DC

## 2018-04-18 NOTE — Addendum Note (Signed)
Addended by: Vicie Mutters R on: 04/18/2018 08:19 AM   Modules accepted: Orders

## 2018-04-19 LAB — URINALYSIS, ROUTINE W REFLEX MICROSCOPIC
Bacteria, UA: NONE SEEN /HPF
Bilirubin Urine: NEGATIVE
Glucose, UA: NEGATIVE
HGB URINE DIPSTICK: NEGATIVE
Hyaline Cast: NONE SEEN /LPF
Ketones, ur: NEGATIVE
Nitrite: NEGATIVE
Protein, ur: NEGATIVE
RBC / HPF: NONE SEEN /HPF (ref 0–2)
Specific Gravity, Urine: 1.016 (ref 1.001–1.03)
pH: 6 (ref 5.0–8.0)

## 2018-04-19 LAB — URINE CULTURE
MICRO NUMBER:: 91543309
SPECIMEN QUALITY:: ADEQUATE

## 2018-05-19 NOTE — Progress Notes (Deleted)
Complete Physical  Assessment and Plan: There are no diagnoses linked to this encounter.   Discussed med's effects and SE's. Screening labs and tests as requested with regular follow-up as recommended. Over 40 minutes of exam, counseling, chart review, and critical decision making was performed this visit.   HPI  53 y.o. female  presents for a complete physical. .  Her blood pressure has been controlled at home, today their BP is   She does not workout due to lower back pain.  She denies chest pain, shortness of breath, dizziness.  She is not on cholesterol medication and denies myalgias. Her cholesterol is at goal. The cholesterol last visit was:   Lab Results  Component Value Date   CHOL 213 (H) 05/14/2017   HDL 60 05/14/2017   LDLCALC 127 (H) 05/14/2017   TRIG 144 05/14/2017   CHOLHDL 3.6 05/14/2017    Patient is not on Vitamin D supplement Lab Results  Component Value Date   VD25OH 35 07/23/2017   She is post menopausal, on OTC hormonal therapy that she states helps. Has OV at GYN next month.  She is having vaginal discharge, requesting diflucan.  She has chronic lower back pain and has seen Dr. Nelva Bush in the past, states PT has not helped.  She is on spirolactone for her acne and follows with Dr. Ubaldo Glassing.  She also has stopped gluten that has helped her with her allergies and thinks she may have gluten allergy/celiac, will refer GI since due for colonoscopy.  Continuing lower back pain, muscular, worse with bending. Patient denies fever, hematuria, incontinence, numbness, tingling, weakness and saddle anesthesia  Current Medications:  Current Outpatient Medications on File Prior to Visit  Medication Sig  . Calcium Carbonate-Vitamin D (CALCIUM + D PO) Take by mouth.  . Cholecalciferol (VITAMIN D PO) Take by mouth.  . Coenzyme Q10 (CO Q 10 PO) Take by mouth.  . Cyanocobalamin (VITAMIN B-12 PO) Take by mouth.  . EPINEPHrine 0.3 mg/0.3 mL IJ SOAJ injection Inject 0.3 mg into  the muscle.  . estradiol (CLIMARA - DOSED IN MG/24 HR) 0.05 mg/24hr patch Place 1 patch (0.05 mg total) onto the skin once a week.  . fenofibrate (TRICOR) 145 MG tablet TAKE 1 TABLET(145 MG) BY MOUTH DAILY  . levocetirizine (XYZAL) 5 MG tablet TAKE 1 TABLET(5 MG) BY MOUTH EVERY EVENING  . Multiple Vitamin (THERA) TABS Take 1 tablet by mouth.  . Omega-3 Fatty Acids (OMEGA-3 EPA FISH OIL PO) Take by mouth daily.  . progesterone (PROMETRIUM) 100 MG capsule Take 1 capsule (100 mg total) by mouth at bedtime.  Marland Kitchen spironolactone (ALDACTONE) 100 MG tablet Take 1.5 tablets (150 mg total) by mouth daily.  Marland Kitchen sulfamethoxazole-trimethoprim (BACTRIM DS,SEPTRA DS) 800-160 MG tablet Take 1 tablet by mouth 2 (two) times daily.   No current facility-administered medications on file prior to visit.    Immunization History  Administered Date(s) Administered  . Influenza Split 01/19/2015  . Influenza, Seasonal, Injecte, Preservative Fre 05/02/2016  . Tdap 05/14/2017    Health Maintenance:   Tetanus: 2008 DUE Pneumovax: N/A Prevnar 13: N/A Flu vaccine: 2018 Zostavax: N/A  LMP: 2015 post meonpausal Pap: 11/2014 MGM:12/2014 OVERDUE DEXA: N/A Colonoscopy: DUE EGD: N/A CT AB 2009 CTA 2012  Last Dental Exam: Dr. Jabier Mutton, q 6 months Last Eye Exam: [facility], glasses, q 1-2 years  Patient Care Team: Unk Pinto, MD as PCP - General (Internal Medicine) Phineas Real, Belinda Block, MD as Consulting Physician (Gynecology) Suella Broad, MD  as Consulting Physician (Physical Medicine and Rehabilitation) Rolm Bookbinder, MD as Consulting Physician (Dermatology) Bobbitt, [place] Muta, MD as Consulting Physician (Allergy and Immunology)  Medical History:  Past Medical History:  Diagnosis Date  . Allergy   . Environmental allergies   . Fibroid   . Hyperlipidemia   . Osteopenia 07/2017   Score -1.5 FRAX 4.6% / 0.2%   Allergies Allergies  Allergen Reactions  . Molds & Smuts Rash    Pt  was tested at allergist.   . Gluten Meal     Has been tested and received a "false negative"  . Tuna [Fish Allergy] Itching    Hives; hypotension Also anchovies    SURGICAL HISTORY She  has a past surgical history that includes Appendectomy; Myomectomy; and Hysteroscopy (2007). FAMILY HISTORY Her family history includes Breast cancer in her mother; Cancer in her maternal uncle and mother; Heart disease in her maternal grandmother. SOCIAL HISTORY She  reports that she has never smoked. She has never used smokeless tobacco. She reports current alcohol use. She reports that she does not use drugs. 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   Review of Systems: Review of Systems  Constitutional: Negative for chills, diaphoresis, fever, malaise/fatigue and weight loss.  HENT: Negative for congestion, ear discharge, ear pain, hearing loss, nosebleeds, sore throat and tinnitus.   Eyes: Negative.   Respiratory: Negative.  Negative for stridor.   Cardiovascular: Negative.   Gastrointestinal: Negative.   Genitourinary: Negative.        Thick white discharge  Musculoskeletal: Positive for back pain. Negative for falls, joint pain, myalgias and neck pain.  Skin: Negative.   Neurological: Negative.  Negative for weakness and headaches.  Psychiatric/Behavioral: Negative.     Physical Exam: Estimated body mass index is 23.99 kg/m as calculated from the following:   Height as of 04/17/18: 5' 5.5" (1.664 m).   Weight as of 04/17/18: 146 lb 6.4 oz (66.4 kg). There were no vitals taken for this visit. General Appearance: Well nourished, in no apparent distress.  Eyes: PERRLA, EOMs, conjunctiva no swelling or erythema, normal fundi and vessels.  Sinuses: No Frontal/maxillary tenderness  ENT/Mouth: Ext aud canals clear, normal light reflex with TMs without erythema, bulging. Good dentition. No erythema, swelling, or exudate on post pharynx. Tonsils not swollen or  erythematous. Hearing normal.  Neck: Supple, thyroid normal. No bruits  Respiratory: Respiratory effort normal, BS equal bilaterally without rales, rhonchi, wheezing or stridor.  Cardio: RRR without murmurs, rubs or gallops. Brisk peripheral pulses without edema.  Chest: symmetric, with normal excursions and percussion.  Breasts: defer  Abdomen: Soft, nontender, no guarding, rebound, hernias, masses, or organomegaly.  Lymphatics: Non tender without lymphadenopathy.  Genitourinary: defer Musculoskeletal: Full ROM all peripheral extremities,5/5 strength, and normal gait.  Skin: Warm, dry without rashes, lesions, ecchymosis. Neuro: Cranial nerves intact, reflexes equal bilaterally. Normal muscle tone, no cerebellar symptoms. Sensation intact.  Psych: Awake and oriented X 3, normal affect, Insight and Judgment appropriate.   EKG: WNL, IRBBB, no ST changes rate 74 AORTA SCAN: defer  Vicie Mutters 8:29 AM [facility]

## 2018-05-20 ENCOUNTER — Encounter: Payer: Self-pay | Admitting: Physician Assistant

## 2018-06-03 ENCOUNTER — Other Ambulatory Visit: Payer: Self-pay | Admitting: Physician Assistant

## 2018-06-03 DIAGNOSIS — L709 Acne, unspecified: Secondary | ICD-10-CM

## 2018-06-11 ENCOUNTER — Other Ambulatory Visit: Payer: Self-pay | Admitting: Physician Assistant

## 2018-06-11 DIAGNOSIS — E785 Hyperlipidemia, unspecified: Secondary | ICD-10-CM

## 2018-07-17 ENCOUNTER — Other Ambulatory Visit: Payer: Self-pay

## 2018-07-21 ENCOUNTER — Encounter: Payer: BLUE CROSS/BLUE SHIELD | Admitting: Obstetrics & Gynecology

## 2018-07-27 ENCOUNTER — Other Ambulatory Visit: Payer: Self-pay | Admitting: Obstetrics & Gynecology

## 2018-08-25 ENCOUNTER — Other Ambulatory Visit: Payer: Self-pay

## 2018-08-26 ENCOUNTER — Telehealth: Payer: Self-pay

## 2018-08-26 NOTE — Telephone Encounter (Signed)
Patient complains of " severe bloating".  States she is 130 pounds but her abdomen looks like she is "4-6 months pregnant".  She said she is "very uncomfortable" and "has discomforting feelings about this".  She said she read an article where HRT can cause this and to ask your MD to adjust your medication and that is what she is asking for today.  Her last CE was 07/18/2017 and she stated she was delaying that visit due to Covid-19.  Please advise how to have her proceed.

## 2018-08-27 ENCOUNTER — Encounter: Payer: Self-pay | Admitting: Obstetrics & Gynecology

## 2018-08-27 ENCOUNTER — Other Ambulatory Visit: Payer: Self-pay | Admitting: Obstetrics & Gynecology

## 2018-08-27 ENCOUNTER — Other Ambulatory Visit: Payer: Self-pay

## 2018-08-27 DIAGNOSIS — R140 Abdominal distension (gaseous): Secondary | ICD-10-CM

## 2018-08-27 MED ORDER — ESTRADIOL 0.05 MG/24HR TD PTWK: 0.0500 mg | MEDICATED_PATCH | TRANSDERMAL | 0 refills | Status: DC

## 2018-08-27 MED ORDER — PROGESTERONE MICRONIZED 100 MG PO CAPS: 100.0000 mg | ORAL_CAPSULE | Freq: Every day | ORAL | 0 refills | Status: DC

## 2018-08-27 NOTE — Telephone Encounter (Signed)
Needs to come in for visit with me to have a Gyn exam and Pelvic US.  We can adjust her HRT at that visit if all normal.  Recommend adjusting her diet to make sure this is not excess bowel gas/constipation and walking regularly.Marland KitchenMarland Kitchen

## 2018-08-27 NOTE — Telephone Encounter (Signed)
Spoke with patient and relayed Dr. Assunta Curtis note. Order placed for gyn u/s. Patient having some insurance issues she said she wants to get worked out before visit.  Transferred to appointments to schedule. She requested refill on HRT because she is out.

## 2019-01-14 ENCOUNTER — Encounter: Payer: Self-pay | Admitting: Gynecology

## 2019-05-27 ENCOUNTER — Encounter: Payer: Self-pay | Admitting: Physician Assistant

## 2019-09-10 ENCOUNTER — Other Ambulatory Visit: Payer: Self-pay

## 2019-09-10 ENCOUNTER — Ambulatory Visit: Payer: 59 | Admitting: Physician Assistant

## 2019-09-10 ENCOUNTER — Encounter: Payer: Self-pay | Admitting: Physician Assistant

## 2019-09-10 VITALS — BP 100/64 | HR 89 | Temp 97.5°F | Ht 66.0 in | Wt 135.0 lb

## 2019-09-10 DIAGNOSIS — R350 Frequency of micturition: Secondary | ICD-10-CM

## 2019-09-10 DIAGNOSIS — Z1329 Encounter for screening for other suspected endocrine disorder: Secondary | ICD-10-CM

## 2019-09-10 DIAGNOSIS — Z1389 Encounter for screening for other disorder: Secondary | ICD-10-CM

## 2019-09-10 DIAGNOSIS — Z0001 Encounter for general adult medical examination with abnormal findings: Secondary | ICD-10-CM

## 2019-09-10 DIAGNOSIS — Z0000 Encounter for general adult medical examination without abnormal findings: Secondary | ICD-10-CM | POA: Diagnosis not present

## 2019-09-10 DIAGNOSIS — F4329 Adjustment disorder with other symptoms: Secondary | ICD-10-CM

## 2019-09-10 DIAGNOSIS — M545 Low back pain, unspecified: Secondary | ICD-10-CM

## 2019-09-10 DIAGNOSIS — D219 Benign neoplasm of connective and other soft tissue, unspecified: Secondary | ICD-10-CM

## 2019-09-10 DIAGNOSIS — N760 Acute vaginitis: Secondary | ICD-10-CM

## 2019-09-10 DIAGNOSIS — Z91018 Allergy to other foods: Secondary | ICD-10-CM

## 2019-09-10 DIAGNOSIS — Z13220 Encounter for screening for lipoid disorders: Secondary | ICD-10-CM | POA: Diagnosis not present

## 2019-09-10 DIAGNOSIS — Z79899 Other long term (current) drug therapy: Secondary | ICD-10-CM | POA: Diagnosis not present

## 2019-09-10 DIAGNOSIS — Z136 Encounter for screening for cardiovascular disorders: Secondary | ICD-10-CM

## 2019-09-10 DIAGNOSIS — E559 Vitamin D deficiency, unspecified: Secondary | ICD-10-CM

## 2019-09-10 DIAGNOSIS — I10 Essential (primary) hypertension: Secondary | ICD-10-CM | POA: Diagnosis not present

## 2019-09-10 DIAGNOSIS — E785 Hyperlipidemia, unspecified: Secondary | ICD-10-CM

## 2019-09-10 DIAGNOSIS — L709 Acne, unspecified: Secondary | ICD-10-CM

## 2019-09-10 DIAGNOSIS — Z130 Encounter for screening for diseases of the blood and blood-forming organs and certain disorders involving the immune mechanism: Secondary | ICD-10-CM

## 2019-09-10 DIAGNOSIS — Z9109 Other allergy status, other than to drugs and biological substances: Secondary | ICD-10-CM

## 2019-09-10 DIAGNOSIS — G8929 Other chronic pain: Secondary | ICD-10-CM

## 2019-09-10 DIAGNOSIS — B9689 Other specified bacterial agents as the cause of diseases classified elsewhere: Secondary | ICD-10-CM

## 2019-09-10 MED ORDER — COMBIPATCH 0.05-0.14 MG/DAY TD PTTW: 1.0000 | MEDICATED_PATCH | TRANSDERMAL | 4 refills | Status: DC

## 2019-09-10 MED ORDER — BORIC ACID EX GRAN: GRANULES | CUTANEOUS | 0 refills | Status: DC

## 2019-09-10 NOTE — Patient Instructions (Addendum)
I encourage you to take time for yourself, schedule it on your calendar.   HOW TO SCHEDULE A MAMMOGRAM  [facility]  7 a.m.-6:30 p.m., Monday 7 a.m.-5 p.m., Tuesday-Friday Schedule an appointment by calling [phone].  Check with Dr. Blanch Media office if you are due 5 years or 10 years for colonoscopy, I think it is 10 years Phone: [phone]   If it reoccurs you can get boric acid supporistories prescription to take to a compound pharamcy or can get from [employer].  Use dove soap only No dryer sheets hypoallergenic soap for undies.   Bacterial Vaginosis   Bacterial vaginosis is a vaginal infection that occurs when the normal balance of bacteria in the vagina is disrupted. It results from an overgrowth of certain bacteria. This is the most common vaginal infection among women ages 42-44. Because bacterial vaginosis increases your risk for STIs (sexually transmitted infections), getting treated can help reduce your risk for chlamydia, gonorrhea, herpes, and HIV (human immunodeficiency virus). Treatment is also important for preventing complications in pregnant women, because this condition can cause an early (premature) delivery. What are the causes? This condition is caused by an increase in harmful bacteria that are normally present in small amounts in the vagina. However, the reason that the condition develops is not fully understood. What increases the risk? The following factors may make you more likely to develop this condition:  Having a new sexual partner or multiple sexual partners.  Having unprotected sex.  Douching.  Having an intrauterine device (IUD).  Smoking.  Drug and alcohol abuse.  Taking certain antibiotic medicines.  Being pregnant. You cannot get bacterial vaginosis from toilet seats, bedding, swimming pools, or contact with objects around you. What are the signs or symptoms? Symptoms of this condition  include:  Grey or white vaginal discharge. The discharge can also be watery or foamy.  A fish-like odor with discharge, especially after sexual intercourse or during menstruation.  Itching in and around the vagina.  Burning or pain with urination. Some women with bacterial vaginosis have no signs or symptoms. How is this diagnosed? This condition is diagnosed based on:  Your medical history.  A physical exam of the vagina.  Testing a sample of vaginal fluid under a microscope to look for a large amount of bad bacteria or abnormal cells. Your health care provider may use a cotton swab or a small wooden spatula to collect the sample. How is this treated? This condition is treated with antibiotics. These may be given as a pill, a vaginal cream, or a medicine that is put into the vagina (suppository). If the condition comes back after treatment, a second round of antibiotics may be needed. Follow these instructions at home: Medicines  Take over-the-counter and prescription medicines only as told by your health care provider.  Take or use your antibiotic as told by your health care provider. Do not stop taking or using the antibiotic even if you start to feel better. General instructions  If you have a female sexual partner, tell her that you have a vaginal infection. She should see her health care provider and be treated if she has symptoms. If you have a female sexual partner, he does not need treatment.  During treatment: ? Avoid sexual activity until you finish treatment. ? Do not douche. ? Avoid alcohol as directed by your health care provider. ? Avoid breastfeeding as directed by your health care provider.  Drink enough water and fluids to  keep your urine clear or pale yellow.  Keep the area around your vagina and rectum clean. ? Wash the area daily with warm water. ? Wipe yourself from front to back after using the toilet.  Keep all follow-up visits as told by your health  care provider. This is important. How is this prevented?  Do not douche.  Wash the outside of your vagina with warm water only.  Use protection when having sex. This includes latex condoms and dental dams.  Limit how many sexual partners you have. To help prevent bacterial vaginosis, it is best to have sex with just one partner (monogamous).  Make sure you and your sexual partner are tested for STIs.  Wear cotton or cotton-lined underwear.  Avoid wearing tight pants and pantyhose, especially during summer.  Limit the amount of alcohol that you drink.  Do not use any products that contain nicotine or tobacco, such as cigarettes and e-cigarettes. If you need help quitting, ask your health care provider.  Do not use illegal drugs. Where to find more information  Centers for Disease Control and Prevention: AppraiserFraud.fi  American Sexual Health Association (ASHA): www.ashastd.org  U.S. Department of Health and Financial controller, Office on Women's Health: DustingSprays.pl or SecuritiesCard.it Contact a health care provider if:  Your symptoms do not improve, even after treatment.  You have more discharge or pain when urinating.  You have a fever.  You have pain in your abdomen.  You have pain during sex.  You have vaginal bleeding between periods. Summary  Bacterial vaginosis is a vaginal infection that occurs when the normal balance of bacteria in the vagina is disrupted.  Because bacterial vaginosis increases your risk for STIs (sexually transmitted infections), getting treated can help reduce your risk for chlamydia, gonorrhea, herpes, and HIV (human immunodeficiency virus). Treatment is also important for preventing complications in pregnant women, because the condition can cause an early (premature) delivery.  This condition is treated with antibiotic medicines. These may be given as a pill, a vaginal cream, or a medicine  that is put into the vagina (suppository). This information is not intended to replace advice given to you by your health care provider. Make sure you discuss any questions you have with your health care provider. Document Released: 04/09/2005 Document Revised: 08/13/2016 Document Reviewed: 12/24/2015 Elsevier Interactive Patient Education  2019 [employer].    Counseling services   I suggest calling your insurance and finding out who is in your network and THEN calling those people or looking them up on google.   I'm a big fan of Cognitive Behavioral Therapy, look this up on You tube or check with the therapist you see if they are certified.  This form of therapy helps to teach you skills to better handle with current situation that are causing anxiety or depression.   There are some great apps too Check out [place], give thanks app.  Meditations apps are great like headspace.    Check out sleep apps for relaxation.   11 Tips to Follow:  1. No caffeine after 3pm: Avoid beverages with caffeine (soda, tea, energy drinks, etc.) especially after 3pm. 2. Don't go to bed hungry: Have your evening meal at least 3 hrs. before going to sleep. It's fine to have a small bedtime snack such as a glass of milk and a few crackers but don't have a big meal. 3. Have a nightly routine before bed: Plan on "winding down" before you go to sleep. Begin relaxing about 1  hour before you go to bed. Try doing a quiet activity such as listening to calming music, reading a book or meditating. 4. Turn off the TV and ALL electronics including video games, tablets, laptops, etc. 1 hour before sleep, and keep them out of the bedroom. 5. Turn off your cell phone and all notifications (new email and text alerts) or even better, leave your phone outside your room while you sleep. Studies have shown that a part of your brain continues to respond to certain lights and sounds even while you're still asleep. 6. Make your  bedroom quiet, dark and cool. If you can't control the noise, try wearing earplugs or using a fan to block out other sounds. 7. Practice relaxation techniques. Try reading a book or meditating or drain your brain by writing a list of what you need to do the next day. 8. Don't nap unless you feel sick: you'll have a better night's sleep. 9. Don't smoke, or quit if you do. Nicotine, alcohol, and marijuana can all keep you awake. Talk to your health care provider if you need help with substance use. 10. Most importantly, wake up at the same time every day (or within 1 hour of your usual wake up time) EVEN on the weekends. A regular wake up time promotes sleep hygiene and prevents sleep problems. 11. Reduce exposure to bright light in the last three hours of the day before going to sleep. Maintaining good sleep hygiene and having good sleep habits lower your risk of developing sleep problems. Getting better sleep can also improve your concentration and alertness. Try the simple steps in this guide. If you still have trouble getting enough rest, make an appointment with your health care provider.

## 2019-09-10 NOTE — Progress Notes (Signed)
Complete Physical  Assessment and Plan:  Encounter for general adult medical examination with abnormal findings 1 year  GET MGM- number given Check with GI about 5 versus 10 year colonoscopy with new guidelines- number given  Hyperlipidemia, unspecified hyperlipidemia type -     Lipid panel check lipids decrease fatty foods increase activity.   Vitamin D deficiency -     VITAMIN D 25 Hydroxy (Vit-D Deficiency, Fractures)  Environmental allergies Monitor  Acne, unspecified acne type On spirolactone- check labs- continue DERM  Chronic right-sided low back pain without sciatica Can follow up with PT/strength and do proper lifting techniques  Fibroid Continue GYN  Allergic to food Monitor  Screening for hematuria or proteinuria -     Urinalysis, Routine w reflex microscopic -     Microalbumin / creatinine urine ratio  Screening for thyroid disorder -     TSH  Screening for cardiovascular condition -     EKG 12-Lead  Urinary frequency -     Urine Culture check  Bacterial vaginitis -     Boric Acid GRAN; Once as needed Will send in boric acid 600mg  as treatment Follow up with GYN  Screening, anemia, deficiency, iron -     Iron,Total/Total Iron Binding Cap -     Ferritin -     Vitamin B12  Medication management -     CBC with Differential/Platelet -     COMPLETE METABOLIC PANEL WITH GFR -     Magnesium  Stress and adjustment reaction No medications at this time, continue counseling, continue self care Follow up if needed  Post menopause symptoms -     estradiol-norethindrone (COMBIPATCH) 0.05-0.14 MG/DAY; Place 1 patch onto the skin 2 (two) times a week.     Discussed med's effects and SE's. Screening labs and tests as requested with regular follow-up as recommended. Over 40 minutes of exam, counseling, chart review, and critical decision making was performed this visit.   HPI  54 y.o. female  presents for a complete physical. .  She has not  been seen since COVID. Her mom was diagnosed with duodenal cancer, and her best friend was diagnosed with cancer, she states she was laid off with COVID, and she stressed with all of this. She has noticed decreased concentration. She has started grief counseling through hospice with palliative care. She states she does not have very much joy right now, she will watch netflix occ. She has been having trouble sleeping, waking up at night.   Her blood pressure has been controlled at home, today their BP is BP: 100/64 She does not workout due to lower back pain.  She denies chest pain, shortness of breath, dizziness.   She is not on cholesterol medication and denies myalgias. Her cholesterol is at goal. The cholesterol last visit was:   Lab Results  Component Value Date   CHOL 180 09/10/2019   HDL 58 09/10/2019   LDLCALC 101 (H) 09/10/2019   TRIG 115 09/10/2019   CHOLHDL 3.1 09/10/2019    Patient is not on Vitamin D supplement Lab Results  Component Value Date   VD25OH 60 09/10/2019   She is post menopausal, on OTC hormonal therapy that she states helps. Following with doctor at [facility]   She has chronic lower back pain and has seen Dr. Nelva Bush in the past, states PT has not helped. Continuing lower back pain, muscular, worse with bending. Patient denies fever, hematuria, incontinence, numbness, tingling, weakness and saddle anesthesia  She is  on spirolactone for her acne and follows with Dr. Ubaldo Glassing.  She also has stopped gluten that has helped her with her allergies and thinks she may have gluten allergy/celiac, will refer GI since due for colonoscopy.    Current Medications:  Current Outpatient Medications on File Prior to Visit  Medication Sig  . Calcium Carbonate-Vitamin D (CALCIUM + D PO) Take by mouth.  . Cholecalciferol (VITAMIN D PO) Take 5,000 Units by mouth.  . Coenzyme Q10 (CO Q 10 PO) Take by mouth.  . Cyanocobalamin (VITAMIN B-12 PO) Take by mouth.  . EPINEPHrine 0.3 mg/0.3 mL  IJ SOAJ injection Inject 0.3 mg into the muscle.  . fenofibrate (TRICOR) 145 MG tablet TAKE 1 TABLET(145 MG) BY MOUTH DAILY  . levocetirizine (XYZAL) 5 MG tablet TAKE 1 TABLET(5 MG) BY MOUTH EVERY EVENING  . Multiple Vitamin (THERA) TABS Take 1 tablet by mouth.  . spironolactone (ALDACTONE) 100 MG tablet TAKE 1 AND 1/2 TABLETS(150 MG) BY MOUTH DAILY   No current facility-administered medications on file prior to visit.   Immunization History  Administered Date(s) Administered  . Influenza Split 01/19/2015  . Influenza, Seasonal, Injecte, Preservative Fre 05/02/2016  . Influenza-Unspecified 12/22/2017  . Tdap 05/14/2017    Health Maintenance:   Tetanus: 2008 DUE Pneumovax: N/A Prevnar 13: N/A Flu vaccine: 2018 Zostavax: N/A  LMP: 2015 post meonpausal Pap: 06/2017 negative HPV MGM:06/2017 OVERDUE DEXA: N/A Colonoscopy: 2016 1 sessile polyp Dr. Henrene Pastor EGD: N/A CT AB 2009 CTA 2012  Last Dental Exam: Dr. Jabier Mutton, q 6 months Last Eye Exam: [facility], glasses, q 1-2 years  Patient Care Team: Unk Pinto, MD as PCP - General (Internal Medicine) Phineas Real, Belinda Block, MD (Inactive) as Consulting Physician (Gynecology) Suella Broad, MD as Consulting Physician (Physical Medicine and Rehabilitation) Rolm Bookbinder, MD as Consulting Physician (Dermatology) Bobbitt, [place] Muta, MD as Consulting Physician (Allergy and Immunology) Irene Shipper, MD as Consulting Physician (Gastroenterology)  Medical History:  Past Medical History:  Diagnosis Date  . Allergy   . Environmental allergies   . Fibroid   . Hyperlipidemia   . Osteopenia 07/2017   Score -1.5 FRAX 4.6% / 0.2%   Allergies Allergies  Allergen Reactions  . Molds & Smuts Rash    Pt was tested at allergist.   . Gluten Meal     Has been tested and received a "false negative"  . Tuna [Fish Allergy] Itching    Hives; hypotension Also anchovies    SURGICAL HISTORY She  has a past surgical history  that includes Appendectomy; Myomectomy; and Hysteroscopy (2007). FAMILY HISTORY Her family history includes Breast cancer in her mother; Cancer in her maternal uncle and mother; Heart disease in her maternal grandmother. SOCIAL HISTORY She  reports that she has never smoked. She has never used smokeless tobacco. She reports current alcohol use. She reports that she does not use drugs. 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    Review of Systems: Review of Systems  Constitutional: Negative for chills, diaphoresis, fever, malaise/fatigue and weight loss.  HENT: Negative for congestion, ear discharge, ear pain, hearing loss, nosebleeds, sore throat and tinnitus.   Eyes: Negative.   Respiratory: Negative.  Negative for stridor.   Cardiovascular: Negative.   Gastrointestinal: Negative.   Genitourinary: Negative.        Thick white discharge  Musculoskeletal: Positive for back pain. Negative for falls, joint pain, myalgias and neck pain.  Skin: Negative.   Neurological: Negative.  Negative for weakness and headaches.  Psychiatric/Behavioral: Negative.     Physical Exam: Estimated body mass index is 21.79 kg/m as calculated from the following:   Height as of this encounter: 5\' 6"  (1.676 m).   Weight as of this encounter: 135 lb (61.2 kg). BP 100/64   Pulse 89   Temp (!) 97.5 F (36.4 C)   Ht 5\' 6"  (1.676 m)   Wt 135 lb (61.2 kg)   SpO2 98%   BMI 21.79 kg/m  General Appearance: Well nourished, in no apparent distress.  Eyes: PERRLA, EOMs, conjunctiva no swelling or erythema, normal fundi and vessels.  Sinuses: No Frontal/maxillary tenderness  ENT/Mouth: Ext aud canals clear, normal light reflex with TMs without erythema, bulging. Good dentition. No erythema, swelling, or exudate on post pharynx. Tonsils not swollen or erythematous. Hearing normal.  Neck: Supple, thyroid normal. No bruits  Respiratory: Respiratory effort normal,  BS equal bilaterally without rales, rhonchi, wheezing or stridor.  Cardio: RRR without murmurs, rubs or gallops. Brisk peripheral pulses without edema.  Chest: symmetric, with normal excursions and percussion.  Breasts: defer  Abdomen: Soft, nontender, no guarding, rebound, hernias, masses, or organomegaly.  Lymphatics: Non tender without lymphadenopathy.  Genitourinary: defer Musculoskeletal: Full ROM all peripheral extremities,5/5 strength, and normal gait.  Skin: Warm, dry without rashes, lesions, ecchymosis. Neuro: Cranial nerves intact, reflexes equal bilaterally. Normal muscle tone, no cerebellar symptoms. Sensation intact.  Psych: Awake and oriented X 3, normal affect, Insight and Judgment appropriate.   EKG: WNL, IRBBB, no ST changes rate 74 AORTA SCAN: defer  Vicie Mutters 8:45 AM [facility]

## 2019-09-11 LAB — MICROALBUMIN / CREATININE URINE RATIO
Creatinine, Urine: 62 mg/dL (ref 20–275)
Microalb Creat Ratio: 5 mcg/mg creat (ref <30)
Microalb, Ur: 0.3 mg/dL

## 2019-09-11 LAB — LIPID PANEL
Cholesterol: 180 mg/dL (ref <200)
HDL: 58 mg/dL (ref >=50)
LDL Cholesterol (Calc): 101 mg/dL (calc) — ABNORMAL HIGH
Non-HDL Cholesterol (Calc): 122 mg/dL (calc) (ref <130)
Total CHOL/HDL Ratio: 3.1 (calc) (ref <5.0)
Triglycerides: 115 mg/dL (ref <150)

## 2019-09-11 LAB — CBC WITH DIFFERENTIAL/PLATELET
Absolute Monocytes: 587 cells/uL (ref 200–950)
Basophils Absolute: 73 cells/uL (ref 0–200)
Basophils Relative: 1.1 %
Eosinophils Absolute: 178 cells/uL (ref 15–500)
Eosinophils Relative: 2.7 %
HCT: 39.2 % (ref 35.0–45.0)
Hemoglobin: 13.3 g/dL (ref 11.7–15.5)
Lymphs Abs: 2033 cells/uL (ref 850–3900)
MCH: 31.6 pg (ref 27.0–33.0)
MCHC: 33.9 g/dL (ref 32.0–36.0)
MCV: 93.1 fL (ref 80.0–100.0)
MPV: 10.3 fL (ref 7.5–12.5)
Monocytes Relative: 8.9 %
Neutro Abs: 3729 cells/uL (ref 1500–7800)
Neutrophils Relative %: 56.5 %
Platelets: 376 10*3/uL (ref 140–400)
RBC: 4.21 10*6/uL (ref 3.80–5.10)
RDW: 12 % (ref 11.0–15.0)
Total Lymphocyte: 30.8 %
WBC: 6.6 10*3/uL (ref 3.8–10.8)

## 2019-09-11 LAB — COMPLETE METABOLIC PANEL WITH GFR
AG Ratio: 2 (calc) (ref 1.0–2.5)
ALT: 13 U/L (ref 6–29)
AST: 15 U/L (ref 10–35)
Albumin: 4.5 g/dL (ref 3.6–5.1)
Alkaline phosphatase (APISO): 62 U/L (ref 37–153)
BUN: 11 mg/dL (ref 7–25)
CO2: 32 mmol/L (ref 20–32)
Calcium: 10.2 mg/dL (ref 8.6–10.4)
Chloride: 103 mmol/L (ref 98–110)
Creat: 0.93 mg/dL (ref 0.50–1.05)
GFR, Est African American: 80 mL/min/{1.73_m2} (ref >=60)
GFR, Est Non African American: 69 mL/min/{1.73_m2} (ref >=60)
Globulin: 2.3 g/dL (calc) (ref 1.9–3.7)
Glucose, Bld: 70 mg/dL (ref 65–99)
Potassium: 4.3 mmol/L (ref 3.5–5.3)
Sodium: 140 mmol/L (ref 135–146)
Total Bilirubin: 0.3 mg/dL (ref 0.2–1.2)
Total Protein: 6.8 g/dL (ref 6.1–8.1)

## 2019-09-11 LAB — URINE CULTURE
MICRO NUMBER:: 10502189
SPECIMEN QUALITY:: ADEQUATE

## 2019-09-11 LAB — TSH: TSH: 0.92 mIU/L

## 2019-09-11 LAB — URINALYSIS, ROUTINE W REFLEX MICROSCOPIC
Bilirubin Urine: NEGATIVE
Glucose, UA: NEGATIVE
Hgb urine dipstick: NEGATIVE
Ketones, ur: NEGATIVE
Leukocytes,Ua: NEGATIVE
Nitrite: NEGATIVE
Protein, ur: NEGATIVE
Specific Gravity, Urine: 1.012 (ref 1.001–1.03)
pH: 7 (ref 5.0–8.0)

## 2019-09-11 LAB — MAGNESIUM: Magnesium: 1.9 mg/dL (ref 1.5–2.5)

## 2019-09-11 LAB — VITAMIN D 25 HYDROXY (VIT D DEFICIENCY, FRACTURES): Vit D, 25-Hydroxy: 60 ng/mL (ref 30–100)

## 2019-09-11 LAB — IRON, TOTAL/TOTAL IRON BINDING CAP
%SAT: 23 % (calc) (ref 16–45)
Iron: 95 ug/dL (ref 45–160)
TIBC: 409 mcg/dL (calc) (ref 250–450)

## 2019-09-11 LAB — FERRITIN: Ferritin: 56 ng/mL (ref 16–232)

## 2019-09-11 LAB — VITAMIN B12: Vitamin B-12: 726 pg/mL (ref 200–1100)

## 2019-09-24 ENCOUNTER — Other Ambulatory Visit: Payer: Self-pay | Admitting: Internal Medicine

## 2019-09-24 DIAGNOSIS — Z9109 Other allergy status, other than to drugs and biological substances: Secondary | ICD-10-CM

## 2019-09-28 ENCOUNTER — Other Ambulatory Visit: Payer: Self-pay

## 2019-09-28 DIAGNOSIS — L709 Acne, unspecified: Secondary | ICD-10-CM

## 2019-09-28 DIAGNOSIS — Z9109 Other allergy status, other than to drugs and biological substances: Secondary | ICD-10-CM

## 2019-09-28 DIAGNOSIS — E785 Hyperlipidemia, unspecified: Secondary | ICD-10-CM

## 2019-09-28 MED ORDER — COMBIPATCH 0.05-0.14 MG/DAY TD PTTW: 1.0000 | MEDICATED_PATCH | TRANSDERMAL | 4 refills | Status: DC

## 2019-09-28 MED ORDER — SPIRONOLACTONE 100 MG PO TABS: ORAL_TABLET | ORAL | 3 refills | Status: DC

## 2019-09-28 MED ORDER — FENOFIBRATE 145 MG PO TABS: ORAL_TABLET | ORAL | 3 refills | Status: DC

## 2019-09-28 MED ORDER — LEVOCETIRIZINE DIHYDROCHLORIDE 5 MG PO TABS: ORAL_TABLET | ORAL | 3 refills | Status: DC

## 2019-09-28 MED ORDER — EPINEPHRINE 0.3 MG/0.3ML IJ SOAJ: 0.3000 mg | INTRAMUSCULAR | 1 refills | Status: AC | PRN

## 2019-11-17 ENCOUNTER — Ambulatory Visit (INDEPENDENT_AMBULATORY_CARE_PROVIDER_SITE_OTHER): Payer: 59 | Admitting: Adult Health

## 2019-11-17 ENCOUNTER — Other Ambulatory Visit: Payer: Self-pay

## 2019-11-17 ENCOUNTER — Encounter: Payer: Self-pay | Admitting: Adult Health

## 2019-11-17 VITALS — BP 104/66 | HR 80 | Temp 97.0°F | Resp 16 | Ht 66.0 in | Wt 135.8 lb

## 2019-11-17 DIAGNOSIS — M545 Low back pain, unspecified: Secondary | ICD-10-CM

## 2019-11-17 DIAGNOSIS — M62838 Other muscle spasm: Secondary | ICD-10-CM

## 2019-11-17 MED ORDER — TIZANIDINE HCL 4 MG PO TABS: 2.0000 mg | ORAL_TABLET | Freq: Three times a day (TID) | ORAL | 1 refills | Status: DC | PRN

## 2019-11-17 MED ORDER — DICLOFENAC SODIUM 75 MG PO TBEC: DELAYED_RELEASE_TABLET | ORAL | 0 refills | Status: DC

## 2019-11-17 MED ORDER — DEXAMETHASONE SODIUM PHOSPHATE 100 MG/10ML IJ SOLN: 10.0000 mg | Freq: Once | INTRAMUSCULAR | Status: DC

## 2019-11-17 NOTE — Progress Notes (Signed)
Dexamethasone 10 mg/ml given IM in right upper outer quadrant.

## 2019-11-17 NOTE — Progress Notes (Signed)
Assessment and Plan:  Erica [name] was seen today for back pain.  Diagnoses and all orders for this visit:  Acute right-sided low back pain without sciatica Right SI pain, negative straight leg, no bowel/bladder problems Diclofenac, tizanidine, heat application, and exercise given If not better follow up with ortho -     tiZANidine (ZANAFLEX) 4 MG tablet; Take 0.5-1 tablets (2-4 mg total) by mouth every 8 (eight) hours as needed for muscle spasms. -     diclofenac (VOLTAREN) 75 MG EC tablet; Take 1 tab twice daily after food for 10-14 days, then BID as needed. -     dexamethasone (DECADRON) injection 10 mg  Further disposition pending results of labs. Discussed med's effects and SE's.   Over 15 minutes of exam, counseling, chart review, and critical decision making was performed.   Future Appointments  Date Time Provider [place]  09/12/2020  3:00 PM Vicie Mutters, PA-C [id number] None    ------------------------------------------------------------------------------------------------------------------   HPI BP 104/66   Pulse 80   Temp (!) 97 F (36.1 C)   Resp 16   Ht 5\' 6"  (1.676 m)   Wt 135 lb 12.8 oz (61.6 kg)   BMI 21.92 kg/m   54 y.o.female presents for evaluation due to acute flare of lower back pain flare. She recently restarted PT, 4 weeks, doing slow progression. 3 days ago 1 hour after she completed stretches, started to sit down in recliner when she felt her back "locked up" and could barely stand up due to pain and stiffness "body just wasn't cooperating." This gradually subsided later that day lying down with her knees popped up. She reports pain has subsided, now more sore and sensation of tight muscle. Has been resting and applying heat with benefit.   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 Was initiated on PT but wasn't able to complete due to covid 19 pandemic, recently restarted 4 weeks ago (going once weekly - Buren Kos at [facility]).    She recalls similar episode when starting PT the first time and was given steroid shot and diclofenac 75 mg BID, tizanidine with good results.   Past Medical History:  Diagnosis Date  . Allergy   . Environmental allergies   . Fibroid   . Hyperlipidemia   . Osteopenia 07/2017   Score -1.5 FRAX 4.6% / 0.2%     Allergies  Allergen Reactions  . Molds & Smuts Rash    Pt was tested at allergist.   . Gluten Meal     Has been tested and received a "false negative"  . Tuna [Fish Allergy] Itching    Hives; hypotension Also anchovies    Current Outpatient Medications on File Prior to Visit  Medication Sig  . Boric Acid GRAN Once as needed  . Calcium Carbonate-Vitamin D (CALCIUM + D PO) Take by mouth.  . Cholecalciferol (VITAMIN D PO) Take 5,000 Units by mouth.  . Coenzyme Q10 (CO Q 10 PO) Take by mouth.  . Cyanocobalamin (VITAMIN B-12 PO) Take by mouth.  . EPINEPHrine 0.3 mg/0.3 mL IJ SOAJ injection Inject 0.3 mLs (0.3 mg total) into the muscle as needed for anaphylaxis.  Marland Kitchen estradiol-norethindrone (COMBIPATCH) 0.05-0.14 MG/DAY Place 1 patch onto the skin 2 (two) times a week.  . fenofibrate (TRICOR) 145 MG tablet Take one tablet daily OR as  DIRECTED BY YOUR PROVIDER  . levocetirizine (XYZAL) 5 MG tablet Take 1 tablet Daily  for Alergies  . Multiple Vitamin (THERA) TABS Take 1 tablet by mouth.  . spironolactone (ALDACTONE) 100 MG tablet TAKE 1 AND 1/2 TABLETS(150 MG) BY MOUTH DAILY   No current facility-administered medications on file prior to visit.    ROS: all negative except above.   Physical Exam:  BP 104/66   Pulse 80   Temp (!) 97 F (36.1 C)   Resp 16   Ht 5\' 6"  (1.676 m)   Wt 135 lb 12.8 oz (61.6 kg)   BMI 21.92 kg/m   General Appearance: Well nourished, in no apparent distress. Eyes: PERRLA, EOMs,  conjunctiva no swelling or erythema Sinuses: No Frontal/maxillary tenderness ENT/Mouth: Mask Hearing normal.  Neck: Supple Respiratory: Respiratory effort normal Cardio: RRR with no MRGs. Brisk peripheral pulses without edema.  Abdomen: Soft, + BS.  Non tender. Lymphatics: Non tender without lymphadenopathy.  Musculoskeletal: Patient is able to ambulate well. Gait is not  Antalgic. Straight leg raising with dorsiflexion negative bilaterally for radicular symptoms. Sensory exam in the legs are normal. Knee reflexes are normal Ankle reflexes are normal Strength is normal and symmetric in arms and legs. There is SI tenderness to palpation.  There is paraspinal muscle spasm.  There is not midline tenderness.  ROM of spine with  limited in all spheres due to pain.  Skin: Warm, dry without rashes, lesions, ecchymosis.  Neuro: Normal muscle tone, Sensation intact.  Psych: Awake and oriented X 3, normal affect, Insight and Judgment appropriate.     Erica Ribas, NP 11:46 AM [facility]

## 2019-11-17 NOTE — Patient Instructions (Addendum)
Take diclofenac after food twice daily for 1-2 weeks, the as needed  Tizanidine - muscle relaxer - may cause some drowsiness - caution taking during the day, don't take with alcohol   Try moist heat, epsom salt baths for tight/spasmed muscle     Tizanidine tablets or capsules What is this medicine? TIZANIDINE (tye ZAN i deen) helps to relieve muscle spasms. It may be used to help in the treatment of multiple sclerosis and spinal cord injury. This medicine may be used for other purposes; ask your health care provider or pharmacist if you have questions. COMMON BRAND NAME(S): Zanaflex What should I tell my health care provider before I take this medicine? They need to know if you have any of these conditions:  kidney disease  liver disease  low blood pressure  mental disorder  an unusual or allergic reaction to tizanidine, other medicines, lactose (tablets only), foods, dyes, or preservatives  pregnant or trying to get pregnant  breast-feeding How should I use this medicine? Take this medicine by mouth with a full glass of water. Take this medicine on an empty stomach, at least 30 minutes before or 2 hours after food. Do not take with food unless you talk with your doctor. Follow the directions on the prescription label. Take your medicine at regular intervals. Do not take your medicine more often than directed. Do not stop taking except on your doctor's advice. Suddenly stopping the medicine can be very dangerous. Talk to your pediatrician regarding the use of this medicine in children. Patients over 62 years old may have a stronger reaction and need a smaller dose. Overdosage: If you think you have taken too much of this medicine contact a poison control center or emergency room at once. NOTE: This medicine is only for you. Do not share this medicine with others. What if I miss a dose? If you miss a dose, take it as soon as you can. If it is almost time for your next dose, take  only that dose. Do not take double or extra doses. What may interact with this medicine? Do not take this medicine with any of the following medications:  ciprofloxacin  fluvoxamine  narcotic medicines for cough  thiabendazole This medicine may also interact with the following medications:  acyclovir  alcohol  antihistamines for allergy, cough, and cold  baclofen  certain medicines for anxiety or sleep  certain medicines for blood pressure, heart disease, irregular heartbeat  certain medicines for depression like amitriptyline, fluoxetine, sertraline  certain medicines for seizures like phenobarbital, primidone  certain medicines for stomach problems like cimetidine, famotidine  female hormones, like estrogens or progestins and birth control pills, patches, rings, or injections  general anesthetics like halothane, isoflurane, methoxyflurane, propofol  local anesthetics like lidocaine, pramoxine, tetracaine  medicines that relax muscles for surgery  narcotic medicines for pain  phenothiazines like chlorpromazine, mesoridazine, prochlorperazine  ticlopidine  zileuton This list may not describe all possible interactions. Give your health care provider a list of all the medicines, herbs, non-prescription drugs, or dietary supplements you use. Also tell them if you smoke, drink alcohol, or use illegal drugs. Some items may interact with your medicine. What should I watch for while using this medicine? Tell your doctor or health care professional if your symptoms do not start to get better or if they get worse. You may get drowsy or dizzy. Do not drive, use machinery, or do anything that needs mental alertness until you know how this medicine affects you.  Do not stand or sit up quickly, especially if you are an older patient. This reduces the risk of dizzy or fainting spells. Alcohol may interfere with the effect of this medicine. Avoid alcoholic drinks. If you are taking  another medicine that also causes drowsiness, you may have more side effects. Give your health care provider a list of all medicines you use. Your doctor will tell you how much medicine to take. Do not take more medicine than directed. Call emergency for help if you have problems breathing or unusual sleepiness. Your mouth may get dry. Chewing sugarless gum or sucking hard candy, and drinking plenty of water may help. Contact your doctor if the problem does not go away or is severe. What side effects may I notice from receiving this medicine? Side effects that you should report to your doctor or health care professional as soon as possible:  allergic reactions like skin rash, itching or hives, swelling of the face, lips, or tongue  breathing problems  hallucinations  signs and symptoms of liver injury like dark yellow or brown urine; general ill feeling or flu-like symptoms; light-colored stools; loss of appetite; nausea; right upper quadrant belly pain; unusually weak or tired; yellowing of the eyes or skin  signs and symptoms of low blood pressure like dizziness; feeling faint or lightheaded, falls; unusually weak or tired  unusually slow heartbeat  unusually weak or tired Side effects that usually do not require medical attention (report to your doctor or health care professional if they continue or are bothersome):  blurred vision  constipation  dizziness  dry mouth  tiredness This list may not describe all possible side effects. Call your doctor for medical advice about side effects. You may report side effects to FDA at 1-800-FDA-1088. Where should I keep my medicine? Keep out of the reach of children. Store at room temperature between 15 and 30 degrees C (59 and 86 degrees F). Throw away any unused medicine after the expiration date. NOTE: This sheet is a summary. It may not cover all possible information. If you have questions about this medicine, talk to your doctor,  pharmacist, or health care provider.  2020 Elsevier/Gold Standard (2017-01-22 13:33:29)    Diclofenac immediate-release tablets What is this medicine? DICLOFENAC (dye KLOE fen ak) is a non-steroidal anti-inflammatory drug (NSAID). It is used to reduce swelling and to treat pain. It may be used to treat osteoarthritis, rheumatoid arthritis, mild to moderate pain, and painful monthly periods. This medicine may be used for other purposes; ask your health care provider or pharmacist if you have questions. COMMON BRAND NAME(S): Cataflam What should I tell my health care provider before I take this medicine? They need to know if you have any of these conditions: asthma, especially aspirin sensitive asthma coronary artery bypass graft (CABG) surgery within the past 2 weeks drink more than 3 alcohol containing drinks a day heart disease or circulation problems like heart failure or leg edema (fluid retention) high blood pressure kidney disease liver disease stomach bleeding or ulcers an unusual or allergic reaction to diclofenac, aspirin, other NSAIDs, other medicines, foods, dyes, or preservatives pregnant or trying to get pregnant breast-feeding How should I use this medicine? Take this medicine by mouth with food and with a full glass of water. Follow the directions on the prescription label. Take your medicine at regular intervals. Do not take your medicine more often than directed. Long-term, continuous use may increase the risk of heart attack or stroke. A special MedGuide  will be given to you by the pharmacist with each prescription and refill. Be sure to read this information carefully each time. Talk to your pediatrician regarding the use of this medicine in children. Special care may be needed. Elderly patients over 51 years old may have a stronger reaction and need a smaller dose. Overdosage: If you think you have taken too much of this medicine contact a poison control center or  emergency room at once. NOTE: This medicine is only for you. Do not share this medicine with others. What if I miss a dose? If you miss a dose, take it as soon as you can. If it is almost time for your next dose, take only that dose. Do not take double or extra doses. What may interact with this medicine? Do not take this medicine with any of the following medications: cidofovir ketorolac methotrexate This medicine may also interact with the following medications: alcohol aspirin and aspirin-like medicines cyclosporine diuretics lithium medicines for blood pressure medicines for osteoporosis medicines that affect platelets medicines that treat or prevent blood clots like warfarin NSAIDs, medicines for pain and inflammation, like ibuprofen or naproxen pemetrexed steroid medicines like prednisone or cortisone This list may not describe all possible interactions. Give your health care provider a list of all the medicines, herbs, non-prescription drugs, or dietary supplements you use. Also tell them if you smoke, drink alcohol, or use illegal drugs. Some items may interact with your medicine. What should I watch for while using this medicine? Tell your doctor or healthcare provider if your pain does not get better. Talk to your doctor before taking another medicine for pain. Do not treat yourself. This medicine may cause serious skin reactions. They can happen weeks to months after starting the medicine. Contact your healthcare provider right away if you notice fevers or flu-like symptoms with a rash. The rash may be red or purple and then turn into blisters or peeling of the skin. Or, you might notice a red rash with swelling of the face, lips or lymph nodes in your neck or under your arms. This medicine does not prevent heart attack or stroke. In fact, this medicine may increase the chance of a heart attack or stroke. The chance may increase with longer use of this medicine and in people who  have heart disease. If you take aspirin to prevent heart attack or stroke, talk with your doctor or healthcare provider. Do not take medicines such as ibuprofen and naproxen with this medicine. Side effects such as stomach upset, nausea, or ulcers may be more likely to occur. Many medicines available without a prescription should not be taken with this medicine. This medicine can cause ulcers and bleeding in the stomach and intestines at any time during treatment. Do not smoke cigarettes or drink alcohol. These increase irritation to your stomach and can make it more susceptible to damage from this medicine. Ulcers and bleeding can happen without warning symptoms and can cause death. You may get drowsy or dizzy. Do not drive, use machinery, or do anything that needs mental alertness until you know how this medicine affects you. Do not stand or sit up quickly, especially if you are an older patient. This reduces the risk of dizzy or fainting spells. This medicine can cause you to bleed more easily. Try to avoid damage to your teeth and gums when you brush or floss your teeth. What side effects may I notice from receiving this medicine? Side effects that you should report  to your doctor or health care professional as soon as possible: allergic reactions like skin rash, itching or hives, swelling of the face, lips, or tongue black or bloody stools, blood in the urine or vomit blurred vision chest pain difficulty breathing or wheezing nausea or vomiting rash, fever, and swollen lymph nodes redness, blistering, peeling or loosening of the skin, including inside the mouth slurred speech or weakness on one side of the body unexplained weight gain or swelling unusually weak or tired yellowing of eyes or skin Side effects that usually do not require medical attention (report to your doctor or health care professional if they continue or are  bothersome): constipation diarrhea dizziness headache heartburn This list may not describe all possible side effects. Call your doctor for medical advice about side effects. You may report side effects to FDA at 1-800-FDA-1088. Where should I keep my medicine? Keep out of the reach of children. Store at room temperature below 30 degrees C (86 degrees F). Protect from moisture. Keep container tightly closed. Throw away any unused medicine after the expiration date. NOTE: This sheet is a summary. It may not cover all possible information. If you have questions about this medicine, talk to your doctor, pharmacist, or health care provider.  2020 Elsevier/Gold Standard (2018-06-25 11:46:22)

## 2019-12-15 ENCOUNTER — Telehealth: Payer: Self-pay | Admitting: Physician Assistant

## 2019-12-15 DIAGNOSIS — L709 Acne, unspecified: Secondary | ICD-10-CM

## 2019-12-15 NOTE — Telephone Encounter (Signed)
Patient called to report pharmacy did not have a rx for spironolactone (ALDACTONE) 100 MG tablet [[id number]] 1 & 1/2 ( 150mg ) daily. Please send To [facility]

## 2019-12-16 MED ORDER — SPIRONOLACTONE 100 MG PO TABS: ORAL_TABLET | ORAL | 3 refills | Status: DC

## 2019-12-16 NOTE — Addendum Note (Signed)
Addended by: Vladimir Crofts on: 12/16/2019 09:13 AM   Modules accepted: Orders

## 2019-12-21 ENCOUNTER — Other Ambulatory Visit: Payer: Self-pay | Admitting: Adult Health

## 2019-12-21 DIAGNOSIS — M545 Low back pain, unspecified: Secondary | ICD-10-CM

## 2019-12-30 ENCOUNTER — Other Ambulatory Visit: Payer: Self-pay

## 2019-12-30 ENCOUNTER — Ambulatory Visit (INDEPENDENT_AMBULATORY_CARE_PROVIDER_SITE_OTHER): Payer: 59 | Admitting: Adult Health

## 2019-12-30 ENCOUNTER — Encounter: Payer: Self-pay | Admitting: Adult Health

## 2019-12-30 VITALS — BP 108/64 | HR 72 | Temp 97.7°F | Ht 66.0 in | Wt 136.0 lb

## 2019-12-30 DIAGNOSIS — N289 Disorder of kidney and ureter, unspecified: Secondary | ICD-10-CM

## 2019-12-30 DIAGNOSIS — K9041 Non-celiac gluten sensitivity: Secondary | ICD-10-CM | POA: Diagnosis not present

## 2019-12-30 DIAGNOSIS — E860 Dehydration: Secondary | ICD-10-CM | POA: Diagnosis not present

## 2019-12-30 DIAGNOSIS — R197 Diarrhea, unspecified: Secondary | ICD-10-CM

## 2019-12-30 DIAGNOSIS — R8290 Unspecified abnormal findings in urine: Secondary | ICD-10-CM | POA: Diagnosis not present

## 2019-12-30 DIAGNOSIS — K5790 Diverticulosis of intestine, part unspecified, without perforation or abscess without bleeding: Secondary | ICD-10-CM

## 2019-12-30 HISTORY — DX: Diverticulosis of intestine, part unspecified, without perforation or abscess without bleeding: K57.90

## 2019-12-30 MED ORDER — HYOSCYAMINE SULFATE 0.125 MG SL SUBL: SUBLINGUAL_TABLET | SUBLINGUAL | 0 refills | Status: DC

## 2019-12-30 NOTE — Progress Notes (Signed)
Assessment and Plan:  Erica [name] was seen today for abdominal pain, chills and vomiting.  Diagnoses and all orders for this visit:  Diarrhea, unspecified type Gluten intolerance 2 weeks of persistent watery diarrhea without blood or mucus Patient reported hx of gluten sensitivity with consumption prior to onset Will check celiac panel  However sx persistent despite strict gluten free diet Possible inflammatory bowel disease; check fecal calprotectin Will r/o c. Diff and infectious etiology Check labs. Bland, gluten free diet, pending workup Continue imodium 2 tabs AM then 1 tab with each liquid stool, may try bismuth product, add levsin if needed Call if bloody stools, worsening diarrhea, not voiding regularly, unable to take oral fluids, vomiting, high fever, severe weakness, abdominal pain or failure to improve in 5-7 days Alternate water with Pedialyte or similar while having frequent liquid stools If unclear etiology low threshold for referral back to GI, est with Dr. Henrene Pastor Please go to the ER if you have any severe AB pain, unable to hold down food/water, blood in stool or vomit, chest pain, shortness of breath, syncope -     CBC with Differential/Platelet -     COMPLETE METABOLIC PANEL WITH GFR -     Sedimentation rate -     C-reactive protein -     Celiac Disease Comprehensive Panel with Reflexes -     Gastrointestinal Pathogen Panel PCR -     CALPROTECTIN -     Urinalysis, Routine w reflex microscopic -     hyoscyamine (LEVSIN SL) 0.125 MG SL tablet; Take 1 to 2 tablets 3 to 4 x day if needed for Nausea, vomiting, cramping or diarrhea  Further disposition pending results of labs. Discussed med's effects and SE's.   Over 30 minutes of exam, counseling, chart review, and critical decision making was performed.   Future Appointments  Date Time Provider [place]  09/12/2020  4:00 PM Liane Comber, NP [facility] None     ------------------------------------------------------------------------------------------------------------------   HPI BP 108/64   Pulse 72   Temp 97.7 F (36.5 C)   Ht 5\' 6"  (1.676 m)   Wt 136 lb (61.7 kg)   SpO2 97%   BMI 21.95 kg/m   54 y.o.female presents for evaluation of 2 weeks of persistent diarrhea and episode of abdominal pain/emesis last night. Rapid covid 19 test today was negative.   She reports hx of severe gluten sensitivity (without positive celiac's, negative check by allergist while on gluten free diet in the past); reports had several bites of gluten containing food over 1-2 days at a family event. Started having diarrhea, started imodium then developed chills, no fever, has since resolved. Watery to loose stools, now 2-3 episodes per day. Denies nausea, vomiting initially. Reports over the last few weeks has started developed vague/generalized discomfort, "just hurting" (struggles to characterize), mild, 2-3/10, typically 30-60 min after eating, has been eating soups, eggs, etc bland foods without improvement. Will resolve 1-2 hours spontaneously.  She denies black stools, blood, mucus in stools.   Yesterday had a late lunch of soup, started having severe bloating and gas pains, wasn't improving, progressive ~8/10 which was unusual, at 10 pm drove to [facility] to get more imodium, gas pills and another unknown liquid medication for "stomach upset" (not pepto bismol), became very flushed and felt unstable, started throwing up uncontrollably on her way out, 3-4 episodes, threw up the noodles, non-bilious, no blood in emesis. Did feel better after throwing up and was able to sleep. No fever/chills.  This AM woke up still having diarrhea, drained, tired with headache but no abd pain, N/V. Took the above meds, diarrhea is better, only 1 episode of diarrhea.  Has been taking imodium to manage diarrhea, would take in AM after having a loose stool, PRN only.   Denies belching,  burning, reflux. Has had some bloating.   She denies abx use in the last 6 months, denies ETOH,  Rare aleve, last this AM for headache, used rarely prior to onset of sx  Has had appendectomy in 2002,  Had colonoscopy in 2016 by Dr. Henrene Pastor, 1 polyp due for follow up, also colon diverticulosis   Past Medical History:  Diagnosis Date  . Allergy   . Environmental allergies   . Fibroid   . Hyperlipidemia   . Osteopenia 07/2017   Score -1.5 FRAX 4.6% / 0.2%     Allergies  Allergen Reactions  . Molds & Smuts Rash    Pt was tested at allergist.   . Gluten Meal     Has been tested and received a "false negative"  . Tuna [Fish Allergy] Itching    Hives; hypotension Also anchovies    Current Outpatient Medications on File Prior to Visit  Medication Sig  . Boric Acid GRAN Once as needed  . Calcium Carbonate-Vitamin D (CALCIUM + D PO) Take by mouth.  . Cholecalciferol (VITAMIN D PO) Take 5,000 Units by mouth.  . Coenzyme Q10 (CO Q 10 PO) Take by mouth.  . diclofenac (VOLTAREN) 75 MG EC tablet TAKE 1 TABLET BY MOUTH TWICE DAILY AFTER FOOD FOR 10 TO 14 DAYS AS NEEDED  . EPINEPHrine 0.3 mg/0.3 mL IJ SOAJ injection Inject 0.3 mLs (0.3 mg total) into the muscle as needed for anaphylaxis.  Marland Kitchen estradiol-norethindrone (COMBIPATCH) 0.05-0.14 MG/DAY Place 1 patch onto the skin 2 (two) times a week.  . fenofibrate (TRICOR) 145 MG tablet Take one tablet daily OR as DIRECTED BY YOUR PROVIDER  . levocetirizine (XYZAL) 5 MG tablet Take 1 tablet Daily  for Alergies  . Multiple Vitamin (THERA) TABS Take 1 tablet by mouth.  . spironolactone (ALDACTONE) 100 MG tablet TAKE 1 AND 1/2 TABLETS(150 MG) BY MOUTH DAILY  . tiZANidine (ZANAFLEX) 4 MG tablet Take 0.5-1 tablets (2-4 mg total) by mouth every 8 (eight) hours as needed for muscle spasms.  . Cyanocobalamin (VITAMIN B-12 PO) Take by mouth. (Patient not taking: Reported on 12/30/2019)   Current Facility-Administered Medications on File Prior to Visit   Medication  . dexamethasone (DECADRON) injection 10 mg    ROS: all negative except above.   Physical Exam:  BP 108/64   Pulse 72   Temp 97.7 F (36.5 C)   Ht 5\' 6"  (1.676 m)   Wt 136 lb (61.7 kg)   SpO2 97%   BMI 21.95 kg/m   General Appearance: Well nourished, in no acute distress. Eyes: PERRLA, conjunctiva no swelling or erythema ENT/Mouth: No erythema, swelling, or exudate on post pharynx.  Tonsils not swollen or erythematous. Hearing normal.  Neck: Supple, thyroid normal.  Respiratory: Respiratory effort normal, BS equal bilaterally without rales, rhonchi, wheezing or stridor.  Cardio: RRR with no MRGs. Brisk peripheral pulses without edema.  Abdomen: Soft, non-distended + BS.  Mild epigastric tenderness, no guarding, rebound, hernias, masses. Lymphatics: Non tender without lymphadenopathy.  Musculoskeletal: No obvious deformity, normal gait.  Skin: Warm, dry without rashes, lesions, ecchymosis.  Neuro: Cranial nerves intact. Normal muscle tone, no cerebellar symptoms. Sensation intact.  Psych: Awake and oriented  X 3, normal affect, Insight and Judgment appropriate.     Izora Ribas, NP 3:57 PM [facility]

## 2019-12-30 NOTE — Patient Instructions (Signed)
After giving LIQUID stool sample  Start taking 2 tabs imodium daily in the morning, then 1 tab for each liquid stool  Verify "liquid" stomach medication and let me know what this is tomorrow morning  Alternate water and Pedialyte of other electrolyte supplemented drink while having frequent liquid stools  Will recheck celiac's panel today  Ruling out infectious cause of diarrhea (may take 5-7 days for this to result)  Please go to the ER if you have any severe AB pain, unable to hold down food/water for more than 4-6 hours, blood in stool or vomit, chest pain, shortness of breath, fainting, confusion or any worsening symptoms.    Diarrhea, Adult Diarrhea is frequent loose and watery bowel movements. Diarrhea can make you feel weak and cause you to become dehydrated. Dehydration can make you tired and thirsty, cause you to have a dry mouth, and decrease how often you urinate. Diarrhea typically lasts 2-3 days. However, it can last longer if it is a sign of something more serious. It is important to treat your diarrhea as told by your health care provider. Follow these instructions at home: Eating and drinking     Follow these recommendations as told by your health care provider:  Take an oral rehydration solution (ORS). This is an over-the-counter medicine that helps return your body to its normal balance of nutrients and water. It is found at pharmacies and retail stores.  Drink plenty of fluids, such as water, ice chips, diluted fruit juice, and low-calorie sports drinks. You can drink milk also, if desired.  Avoid drinking fluids that contain a lot of sugar or caffeine, such as energy drinks, sports drinks, and soda.  Eat bland, easy-to-digest foods in small amounts as you are able. These foods include bananas, applesauce, rice, lean meats, toast, and crackers.  Avoid alcohol.  Avoid spicy or fatty foods.  Medicines  Take over-the-counter and prescription medicines only as  told by your health care provider.  If you were prescribed an antibiotic medicine, take it as told by your health care provider. Do not stop using the antibiotic even if you start to feel better. General instructions   Wash your hands often using soap and water. If soap and water are not available, use a hand sanitizer. Others in the household should wash their hands as well. Hands should be washed: ? After using the toilet or changing a diaper. ? Before preparing, cooking, or serving food. ? While caring for a sick person or while visiting someone in a hospital.  Drink enough fluid to keep your urine pale yellow.  Rest at home while you recover.  Watch your condition for any changes.  Take a warm bath to relieve any burning or pain from frequent diarrhea episodes.  Keep all follow-up visits as told by your health care provider. This is important. Contact a health care provider if:  You have a fever.  Your diarrhea gets worse.  You have new symptoms.  You cannot keep fluids down.  You feel light-headed or dizzy.  You have a headache.  You have muscle cramps. Get help right away if:  You have chest pain.  You feel extremely weak or you faint.  You have bloody or black stools or stools that look like tar.  You have severe pain, cramping, or bloating in your abdomen.  You have trouble breathing or you are breathing very quickly.  Your heart is beating very quickly.  Your skin feels cold and clammy.  You  feel confused.  You have signs of dehydration, such as: ? Dark urine, very little urine, or no urine. ? Cracked lips. ? Dry mouth. ? Sunken eyes. ? Sleepiness. ? Weakness. Summary  Diarrhea is frequent loose and watery bowel movements. Diarrhea can make you feel weak and cause you to become dehydrated.  Drink enough fluids to keep your urine pale yellow.  Make sure that you wash your hands after using the toilet. If soap and water are not available, use  hand sanitizer.  Contact a health care provider if your diarrhea gets worse or you have new symptoms.  Get help right away if you have signs of dehydration. This information is not intended to replace advice given to you by your health care provider. Make sure you discuss any questions you have with your health care provider. Document Revised: 08/26/2018 Document Reviewed: 09/13/2017 Elsevier Patient Education  [place]. Gluten-Free Diet for Celiac Disease, Adult  The gluten-free diet includes all foods that do not contain gluten. Gluten is a protein that is found in wheat, rye, barley, and some other grains. Following the gluten-free diet is the only treatment for people with celiac disease. It helps to prevent damage to the intestines and improves or eliminates the symptoms of celiac disease. Following the gluten-free diet requires some planning. It can be challenging at first, but it gets easier with time and practice. There are more gluten-free options available today than ever before. If you need help finding gluten-free foods or if you have questions, talk with your diet and nutrition specialist (registered dietitian) or your health care provider. What do I need to know about a gluten-free diet?  All fruits, vegetables, and meats are safe to eat and do not contain gluten.  When grocery shopping, start by shopping in the produce, meat, and dairy sections. These sections are more likely to contain gluten-free foods. Then move to the aisles that contain packaged foods if you need to.  Read all food labels. Gluten is often added to foods. Always check the ingredient list and look for warnings, such as may contain gluten."  Talk with your dietitian or health care provider before taking a gluten-free multivitamin or mineral supplement.  Be aware of gluten-free foods having contact with foods that contain gluten (cross-contamination). This can happen at home and with any processed  foods. ? Talk with your health care provider or dietitian about how to reduce the risk of cross-contamination in your home. ? If you have questions about how a food is processed, ask the manufacturer. What key words help to identify gluten? Foods that list any of these key words on the label usually contain gluten:  Wheat, flour, enriched flour, bromated flour, white flour, durum flour, graham flour, phosphated flour, self-rising flour, semolina, farina, barley (malt), rye, and oats.  Starch, dextrin, modified food starch, or cereal.  Thickening, fillers, or emulsifiers.  Malt flavoring, malt extract, or malt syrup.  Hydrolyzed vegetable protein. In the [place] packaged foods that are gluten-free are required to be labeled GF. These foods should be easy to identify and are safe to eat. In the [place] food companies are also required to list common food allergens, including wheat, on their labels. Recommended foods Grains  Amaranth, bean flours, 100% buckwheat flour, corn, millet, nut flours or nut meals, GF oats, quinoa, rice, sorghum, teff, rice wafers, pure cornmeal tortillas, popcorn, and hot cereals made from cornmeal. Hominy, rice, wild rice. Some Asian rice noodles or bean noodles. Arrowroot starch,  corn bran, corn flour, corn germ, cornmeal, corn starch, potato flour, potato starch flour, and rice bran. Plain, brown, and sweet rice flours. Rice polish, soy flour, and tapioca starch. Vegetables  All plain fresh, frozen, and canned vegetables. Fruits  All plain fresh, frozen, canned, and dried fruits, and 100% fruit juices. Meats and other protein foods  All fresh beef, pork, poultry, fish, seafood, and eggs. Fish canned in water, oil, brine, or vegetable broth. Plain nuts and seeds, peanut butter. Some lunch meat and some frankfurters. Dried beans, dried peas, and lentils. Dairy  Fresh plain, dry, evaporated, or condensed milk. Cream, butter, sour cream, whipping cream, and most  yogurts. Unprocessed cheese, most processed cheeses, some cottage cheese, some cream cheeses. Beverages  Coffee, tea, most herbal teas. Carbonated beverages and some root beers. Wine, sake, and distilled spirits, such as gin, vodka, and whiskey. Most hard ciders. Fats and oils  Butter, margarine, vegetable oil, hydrogenated butter, olive oil, shortening, lard, cream, and some mayonnaise. Some commercial salad dressings. Olives. Sweets and desserts  Sugar, honey, some syrups, molasses, jelly, and jam. Plain hard candy, marshmallows, and gumdrops. Pure cocoa powder. Plain chocolate. Custard and some pudding mixes. Gelatin desserts, sorbets, frozen ice pops, and sherbet. Cake, cookies, and other desserts prepared with allowed flours. Some commercial ice creams. Cornstarch, tapioca, and rice puddings. Seasoning and other foods  Some canned or frozen soups. Monosodium glutamate (MSG). Cider, rice, and wine vinegar. Baking soda and baking powder. Cream of tartar. Baking and nutritional yeast. Certain soy sauces made without wheat (ask your dietitian about specific brands that are allowed). Nuts, coconut, and chocolate. Salt, pepper, herbs, spices, flavoring extracts, imitation or artificial flavorings, natural flavorings, and food colorings. Some medicines and supplements. Some lip glosses and other cosmetics. Rice syrups. The items listed may not be a complete list. Talk with your dietitian about what dietary choices are best for you. Foods to avoid Grains  Barley, bran, bulgur, couscous, cracked wheat, [place], farro, graham, malt, matzo, semolina, wheat germ, and all wheat and rye cereals including spelt and kamut. Cereals containing malt as a flavoring, such as rice cereal. Noodles, spaghetti, macaroni, most packaged rice mixes, and all mixes containing wheat, rye, barley, or triticale. Vegetables  Most creamed vegetables and most vegetables canned in sauces. Some commercially prepared vegetables  and salads. Fruits  Thickened or prepared fruits and some pie fillings. Some fruit snacks and fruit roll-ups. Meats and other protein foods  Any meat or meat alternative containing wheat, rye, barley, or gluten stabilizers. These are often marinated or packaged meats and lunch meats. Bread-containing products, such as Swiss steak, croquettes, meatballs, and meatloaf. Most tuna canned in vegetable broth and [place] with hydrolyzed vegetable protein (HVP) injected as part of the basting. [place]. Imitation fish. Eggs in sauces made from ingredients to avoid. Dairy  Commercial chocolate milk drinks and malted milk. Some non-dairy creamers. Any cheese product containing ingredients to avoid. Beverages  Certain cereal beverages. Beer, ale, malted milk, and some root beers. Some hard ciders. Some instant flavored coffees. Some herbal teas made with barley or with barley malt added. Fats and oils  Some commercial salad dressings. Sour cream containing modified food starch. Sweets and desserts  Some toffees. Chocolate-coated nuts (may be rolled in wheat flour) and some commercial candies and candy bars. Most cakes, cookies, donuts, pastries, and other baked goods. Some commercial ice cream. Ice cream cones. Commercially prepared mixes for cakes, cookies, and other desserts. Bread pudding and other puddings thickened with  flour. Products containing brown rice syrup made with barley malt enzyme. Desserts and sweets made with malt flavoring. Seasoning and other foods  Some curry powders, some dry seasoning mixes, some gravy extracts, some meat sauces, some ketchups, some prepared mustards, and horseradish. Certain soy sauces. Malt vinegar. Bouillon and bouillon cubes that contain HVP. Some chip dips, and some chewing gum. Yeast extract. Brewers yeast. Caramel color. Some medicines and supplements. Some lip glosses and other cosmetics. The items listed may not be a complete list. Talk with your dietitian  about what dietary choices are best for you. Summary  Gluten is a protein that is found in wheat, rye, barley, and some other grains. The gluten-free diet includes all foods that do not contain gluten.  If you need help finding gluten-free foods or if you have questions, talk with your diet and nutrition specialist (registered dietitian) or your health care provider.  Read all food labels. Gluten is often added to foods. Always check the ingredient list and look for warnings, such as may contain gluten." This information is not intended to replace advice given to you by your health care provider. Make sure you discuss any questions you have with your health care provider. Document Revised: 03/22/2017 Document Reviewed: 01/23/2016 Elsevier Patient Education  2020 [employer].

## 2019-12-31 LAB — CBC WITH DIFFERENTIAL/PLATELET
Absolute Monocytes: 889 {cells}/uL (ref 200–950)
Basophils Absolute: 71 {cells}/uL (ref 0–200)
Basophils Relative: 0.7 %
Eosinophils Absolute: 202 {cells}/uL (ref 15–500)
Eosinophils Relative: 2 %
HCT: 37.8 % (ref 35.0–45.0)
Hemoglobin: 12.8 g/dL (ref 11.7–15.5)
Lymphs Abs: 1949 {cells}/uL (ref 850–3900)
MCH: 31.4 pg (ref 27.0–33.0)
MCHC: 33.9 g/dL (ref 32.0–36.0)
MCV: 92.9 fL (ref 80.0–100.0)
MPV: 10.3 fL (ref 7.5–12.5)
Monocytes Relative: 8.8 %
Neutro Abs: 6989 {cells}/uL (ref 1500–7800)
Neutrophils Relative %: 69.2 %
Platelets: 449 10*3/uL — ABNORMAL HIGH (ref 140–400)
RBC: 4.07 Million/uL (ref 3.80–5.10)
RDW: 11.9 % (ref 11.0–15.0)
Total Lymphocyte: 19.3 %
WBC: 10.1 10*3/uL (ref 3.8–10.8)

## 2019-12-31 LAB — URINALYSIS, ROUTINE W REFLEX MICROSCOPIC
Bilirubin Urine: NEGATIVE
Glucose, UA: NEGATIVE
Hgb urine dipstick: NEGATIVE
Nitrite: NEGATIVE
Protein, ur: NEGATIVE
Specific Gravity, Urine: 1.029 (ref 1.001–1.03)
pH: 6 (ref 5.0–8.0)

## 2019-12-31 LAB — COMPLETE METABOLIC PANEL WITH GFR
AG Ratio: 1.6 (calc) (ref 1.0–2.5)
ALT: 13 U/L (ref 6–29)
AST: 14 U/L (ref 10–35)
Albumin: 4.6 g/dL (ref 3.6–5.1)
Alkaline phosphatase (APISO): 56 U/L (ref 37–153)
BUN/Creatinine Ratio: 13 (calc) (ref 6–22)
BUN: 17 mg/dL (ref 7–25)
CO2: 33 mmol/L — ABNORMAL HIGH (ref 20–32)
Calcium: 11.2 mg/dL — ABNORMAL HIGH (ref 8.6–10.4)
Chloride: 101 mmol/L (ref 98–110)
Creat: 1.27 mg/dL — ABNORMAL HIGH (ref 0.50–1.05)
GFR, Est African American: 55 mL/min/{1.73_m2} — ABNORMAL LOW (ref >=60)
GFR, Est Non African American: 47 mL/min/{1.73_m2} — ABNORMAL LOW (ref >=60)
Globulin: 2.8 g/dL (calc) (ref 1.9–3.7)
Glucose, Bld: 102 mg/dL — ABNORMAL HIGH (ref 65–99)
Potassium: 4.5 mmol/L (ref 3.5–5.3)
Sodium: 141 mmol/L (ref 135–146)
Total Bilirubin: 0.5 mg/dL (ref 0.2–1.2)
Total Protein: 7.4 g/dL (ref 6.1–8.1)

## 2019-12-31 LAB — CELIAC DISEASE COMPREHENSIVE PANEL WITH REFLEXES
(tTG) Ab, IgA: 1 U/mL
Immunoglobulin A: 252 mg/dL (ref 47–310)

## 2019-12-31 LAB — SEDIMENTATION RATE: Sed Rate: 43 mm/h — ABNORMAL HIGH (ref 0–30)

## 2019-12-31 LAB — C-REACTIVE PROTEIN: CRP: 28.2 mg/L — ABNORMAL HIGH (ref <8.0)

## 2019-12-31 NOTE — Addendum Note (Signed)
Addended by: Izora Ribas on: 12/31/2019 08:34 AM   Modules accepted: Orders

## 2020-01-01 LAB — URINE CULTURE
MICRO NUMBER:: 10931490
SPECIMEN QUALITY:: ADEQUATE

## 2020-01-04 LAB — GASTROINTESTINAL PATHOGEN PANEL PCR
C. difficile Tox A/B, PCR: NOT DETECTED
Campylobacter, PCR: NOT DETECTED
Cryptosporidium, PCR: NOT DETECTED
E coli (ETEC) LT/ST PCR: NOT DETECTED
E coli (STEC) stx1/stx2, PCR: NOT DETECTED
E coli 0157, PCR: NOT DETECTED
Giardia lamblia, PCR: NOT DETECTED
Norovirus, PCR: NOT DETECTED
Rotavirus A, PCR: NOT DETECTED
Salmonella, PCR: NOT DETECTED
Shigella, PCR: NOT DETECTED

## 2020-01-15 ENCOUNTER — Other Ambulatory Visit: Payer: Self-pay

## 2020-01-15 ENCOUNTER — Other Ambulatory Visit (INDEPENDENT_AMBULATORY_CARE_PROVIDER_SITE_OTHER): Payer: 59

## 2020-01-15 DIAGNOSIS — N289 Disorder of kidney and ureter, unspecified: Secondary | ICD-10-CM

## 2020-01-15 LAB — BASIC METABOLIC PANEL WITH GFR
BUN: 9 mg/dL (ref 7–25)
CO2: 29 mmol/L (ref 20–32)
Calcium: 10.8 mg/dL — ABNORMAL HIGH (ref 8.6–10.4)
Chloride: 104 mmol/L (ref 98–110)
Creat: 0.84 mg/dL (ref 0.50–1.05)
GFR, Est African American: 90 mL/min/{1.73_m2} (ref >=60)
GFR, Est Non African American: 78 mL/min/{1.73_m2} (ref >=60)
Glucose, Bld: 65 mg/dL (ref 65–99)
Potassium: 5.2 mmol/L (ref 3.5–5.3)
Sodium: 140 mmol/L (ref 135–146)

## 2020-01-21 NOTE — Progress Notes (Signed)
Patient is aware of lab results. She states that diarrhea is some better but still lingering and she is still having to take anti-diarrheal medications. -Marcelino Scot

## 2020-01-29 ENCOUNTER — Ambulatory Visit: Payer: 59 | Admitting: Internal Medicine

## 2020-02-02 ENCOUNTER — Other Ambulatory Visit: Payer: Self-pay

## 2020-02-02 ENCOUNTER — Encounter: Payer: Self-pay | Admitting: Adult Health

## 2020-02-02 ENCOUNTER — Ambulatory Visit: Payer: 59 | Admitting: Adult Health

## 2020-02-02 VITALS — BP 104/72 | HR 82 | Temp 97.5°F | Ht 66.0 in | Wt 136.4 lb

## 2020-02-02 DIAGNOSIS — R1030 Lower abdominal pain, unspecified: Secondary | ICD-10-CM | POA: Diagnosis not present

## 2020-02-02 DIAGNOSIS — Z860100 Personal history of colon polyps, unspecified: Secondary | ICD-10-CM

## 2020-02-02 DIAGNOSIS — R350 Frequency of micturition: Secondary | ICD-10-CM

## 2020-02-02 DIAGNOSIS — R102 Pelvic and perineal pain unspecified side: Secondary | ICD-10-CM

## 2020-02-02 DIAGNOSIS — R197 Diarrhea, unspecified: Secondary | ICD-10-CM

## 2020-02-02 DIAGNOSIS — N898 Other specified noninflammatory disorders of vagina: Secondary | ICD-10-CM

## 2020-02-02 DIAGNOSIS — Z9889 Other specified postprocedural states: Secondary | ICD-10-CM

## 2020-02-02 NOTE — Patient Instructions (Addendum)
     YOU CAN CALL TO MAKE AN ULTRASOUND..  I have put in an order for an ultrasound for you to have You can set them up at your convenience by calling this number [phone] You will likely have the ultrasound at [address] [facility]  If you have any issues call our office and we will set this up for you.       Abdominal Pain, Adult Pain in the abdomen (abdominal pain) can be caused by many things. Often, abdominal pain is not serious and it gets better with no treatment or by being treated at home. However, sometimes abdominal pain is serious. Your health care provider will ask questions about your medical history and do a physical exam to try to determine the cause of your abdominal pain. Follow these instructions at home:  Medicines  Take over-the-counter and prescription medicines only as told by your health care provider.  Do not take a laxative unless told by your health care provider. General instructions  Watch your condition for any changes.  Drink enough fluid to keep your urine pale yellow.  Keep all follow-up visits as told by your health care provider. This is important. Contact a health care provider if:  Your abdominal pain changes or gets worse.  You are not hungry or you lose weight without trying.  You are constipated or have diarrhea for more than 2-3 days.  You have pain when you urinate or have a bowel movement.  Your abdominal pain wakes you up at night.  Your pain gets worse with meals, after eating, or with certain foods.  You are vomiting and cannot keep anything down.  You have a fever.  You have blood in your urine. Get help right away if:  Your pain does not go away as soon as your health care provider told you to expect.  You cannot stop vomiting.  Your pain is only in areas of the abdomen, such as the right side or the left lower portion of the abdomen. Pain on the right side could be caused by appendicitis.  You have  bloody or black stools, or stools that look like tar.  You have severe pain, cramping, or bloating in your abdomen.  You have signs of dehydration, such as: ? Dark urine, very little urine, or no urine. ? Cracked lips. ? Dry mouth. ? Sunken eyes. ? Sleepiness. ? Weakness.  You have trouble breathing or chest pain. Summary  Often, abdominal pain is not serious and it gets better with no treatment or by being treated at home. However, sometimes abdominal pain is serious.  Watch your condition for any changes.  Take over-the-counter and prescription medicines only as told by your health care provider.  Contact a health care provider if your abdominal pain changes or gets worse.  Get help right away if you have severe pain, cramping, or bloating in your abdomen. This information is not intended to replace advice given to you by your health care provider. Make sure you discuss any questions you have with your health care provider. Document Revised: 08/18/2018 Document Reviewed: 08/18/2018 Elsevier Patient Education  [place].

## 2020-02-02 NOTE — Progress Notes (Signed)
Assessment and Plan:  Erica [name] was seen today for diarrhea and abdominal pain.  Diagnoses and all orders for this visit:  Lower abdominal pain Diarrhea, unspecified type Persistent 6-7 weeks of diarrhea with negative GI pathogen panel, suspect for possible colitis, IBD  Hx of gluten sensitivity, negative celiac's but discussed gold standard is biopsy to r/o Will refer to [facility] per patient preference for further evaluation and work up -     Ambulatory referral to Gastroenterology  Frequency of urination Pelvic pressure in female Also with new persistent pelvic pressure, ? Urinary frequency that is persistent Declined pelvic exam today; reports normal in 09/2018 by GYN Some lower abdominal fullness  Check UA/culture, she agrees to do self wet prep - tx as indicated due to hx of BV If negative will pursue pelvic US to r/o pelvic mass separate from above GI issue -     Urinalysis w microscopic + reflex cultur -     WET PREP BY MOLECULAR PROBE -     US PELVIC COMPLETE WITH TRANSVAGINAL; Future  Further disposition pending results of labs. Discussed med's effects and SE's.   Over 30 minutes of exam, counseling, chart review, and critical decision making was performed.   Future Appointments  Date Time Provider [place]  09/12/2020  4:00 PM Liane Comber, NP [facility] None    ------------------------------------------------------------------------------------------------------------------   HPI BP 104/72   Pulse 82   Temp (!) 97.5 F (36.4 C)   Ht 5' 6" (1.676 m)   Wt 136 lb 6.4 oz (61.9 kg)   SpO2 98%   BMI 22.02 kg/m   54 y.o.female with hx of diverticulosis, gluten sensitivity with recurrent diarrhea episodes following intake (but repeated negative celiac's - most recently on 12/30/2019 - note she has been following gluten free diet when checked) presents for evaluation of abdominal pain and persistent diarrhea.   She reports fluctuating persistent lower  generalized abdominal pain for 6-7 weeks, severe episode on August 19th, was having sharp pain, recently more of an ache/discomfort, persistent fluctuating, good days and bad days, has good days and bad days, has woken her up at night on some occasions. Endorses pelvic fullness, some questionable urinary frequency.   She was evaluated on 12/31/2019 shortly after onset, had workup including celiac panel, CBC without changes, CMP- showed dehydration, elevated calcium 11.2, UA normal, GI pathogen panel negative, CRP 28.2, ESR 43. Calprotectin was ordered but patient never completed. She was recommended to follow up with GI but misunderstood and never did so.   Has been doing strict gluten free diet, has had pain to the point where sitting was painful, has ranged 2-6/10, hasn't correlated with any alleviating or aggravating factors. Not correlated with food recently. Also has now persistent diarrhea, ranges from lose stools to watery diarrhea. Denies blood or mucus in stool. Denies fever/chills. Has tried levsin without significant improvement. Has been using imodium to manage.   Notably she also reports sense of pelvic fullness, some urinary frequency. Hx of ovarian cyst with surgical excision. She reports hx of BV, UTIs with similar, concern but declined pelvic today.  Denies dysuria, urine character changes. Has had negative STD testing, not sexually active since.   Denies belching, burning, reflux. Has had some bloating.   She denies abx use in the last 6 months, denies ETOH,  Rare aleve, last this AM for headache, used rarely prior to onset of sx  Has had appendectomy in 2002,  Had colonoscopy in 2016 by Dr. Henrene Pastor, 1  sessile serrated polyp was removed, ? due for follow up, also colon diverticulosis   Past Medical History:  Diagnosis Date  . Allergy   . Diverticulosis 12/30/2019  . Environmental allergies   . Fibroid   . Hyperlipidemia   . Osteopenia 07/2017   Score -1.5 FRAX 4.6% / 0.2%      Allergies  Allergen Reactions  . Molds & Smuts Rash    Pt was tested at allergist.   . Gluten Meal     Has been tested and received a "false negative"  . Tuna [Fish Allergy] Itching    Hives; hypotension Also anchovies    Current Outpatient Medications on File Prior to Visit  Medication Sig  . Cholecalciferol (VITAMIN D PO) Take 5,000 Units by mouth.  . EPINEPHrine 0.3 mg/0.3 mL IJ SOAJ injection Inject 0.3 mLs (0.3 mg total) into the muscle as needed for anaphylaxis.  Marland Kitchen estradiol-norethindrone (COMBIPATCH) 0.05-0.14 MG/DAY Place 1 patch onto the skin 2 (two) times a week.  . fenofibrate (TRICOR) 145 MG tablet Take one tablet daily OR as DIRECTED BY YOUR PROVIDER  . hyoscyamine (LEVSIN SL) 0.125 MG SL tablet Take 1 to 2 tablets 3 to 4 x day if needed for Nausea, vomiting, cramping or diarrhea  . levocetirizine (XYZAL) 5 MG tablet Take 1 tablet Daily  for Alergies  . Multiple Vitamin (THERA) TABS Take 1 tablet by mouth.  . spironolactone (ALDACTONE) 100 MG tablet TAKE 1 AND 1/2 TABLETS(150 MG) BY MOUTH DAILY  . tiZANidine (ZANAFLEX) 4 MG tablet Take 0.5-1 tablets (2-4 mg total) by mouth every 8 (eight) hours as needed for muscle spasms.  . Boric Acid GRAN Once as needed (Patient not taking: Reported on 02/02/2020)  . Calcium Carbonate-Vitamin D (CALCIUM + D PO) Take by mouth. (Patient not taking: Reported on 02/02/2020)  . Coenzyme Q10 (CO Q 10 PO) Take by mouth. (Patient not taking: Reported on 02/02/2020)  . Cyanocobalamin (VITAMIN B-12 PO) Take by mouth. (Patient not taking: Reported on 12/30/2019)  . diclofenac (VOLTAREN) 75 MG EC tablet TAKE 1 TABLET BY MOUTH TWICE DAILY AFTER FOOD FOR 10 TO 14 DAYS AS NEEDED (Patient not taking: Reported on 02/02/2020)   Current Facility-Administered Medications on File Prior to Visit  Medication  . dexamethasone (DECADRON) injection 10 mg    ROS: all negative except above.   Physical Exam:  BP 104/72   Pulse 82   Temp (!) 97.5 F (36.4  C)   Ht 5' 6" (1.676 m)   Wt 136 lb 6.4 oz (61.9 kg)   SpO2 98%   BMI 22.02 kg/m   General Appearance: Well nourished, in no acute distress. Eyes: PERRLA, conjunctiva no swelling or erythema ENT/Mouth: No erythema, swelling, or exudate on post pharynx.  Tonsils not swollen or erythematous. Hearing normal.  Neck: Supple, thyroid normal.  Respiratory: Respiratory effort normal, BS equal bilaterally without rales, rhonchi, wheezing or stridor.  Cardio: RRR with no MRGs. Brisk peripheral pulses without edema.  Abdomen: Firm, non-distended + BS.  She has generalized lower abdominal tenderness, no guarding, rebound, hernias, masses. Lymphatics: Non tender without lymphadenopathy.  Musculoskeletal: No obvious deformity, normal gait.  Skin: Warm, dry without rashes, lesions, ecchymosis.  Neuro: Cranial nerves intact. Normal muscle tone, no cerebellar symptoms. Sensation intact.  Psych: Awake and oriented X 3, normal affect, Insight and Judgment appropriate.  GU: declines pelvic exam today   Izora Ribas, NP 2:24 PM [name]

## 2020-02-03 ENCOUNTER — Other Ambulatory Visit: Payer: Self-pay | Admitting: Adult Health

## 2020-02-03 LAB — WET PREP BY MOLECULAR PROBE
Candida species: DETECTED — AB
Gardnerella vaginalis: NOT DETECTED
MICRO NUMBER:: 11062555
SPECIMEN QUALITY:: ADEQUATE
Trichomonas vaginosis: NOT DETECTED

## 2020-02-03 LAB — URINALYSIS W MICROSCOPIC + REFLEX CULTURE
Bacteria, UA: NONE SEEN /HPF
Bilirubin Urine: NEGATIVE
Glucose, UA: NEGATIVE
Hgb urine dipstick: NEGATIVE
Hyaline Cast: NONE SEEN /LPF
Ketones, ur: NEGATIVE
Leukocyte Esterase: NEGATIVE
Nitrites, Initial: NEGATIVE
Protein, ur: NEGATIVE
RBC / HPF: NONE SEEN /HPF (ref 0–2)
Specific Gravity, Urine: 1.013 (ref 1.001–1.03)
Squamous Epithelial / HPF: NONE SEEN /HPF (ref <=5)
WBC, UA: NONE SEEN /HPF (ref 0–5)
pH: 7.5 (ref 5.0–8.0)

## 2020-02-03 LAB — NO CULTURE INDICATED

## 2020-02-03 MED ORDER — FLUCONAZOLE 150 MG PO TABS: 150.0000 mg | ORAL_TABLET | Freq: Once | ORAL | 1 refills | Status: AC

## 2020-02-10 ENCOUNTER — Ambulatory Visit
Admission: RE | Admit: 2020-02-10 | Discharge: 2020-02-10 | Disposition: A | Discharge status: Home / Self Care | Payer: 59 | Source: Ambulatory Visit | Attending: Adult Health | Admitting: Adult Health

## 2020-02-10 ENCOUNTER — Other Ambulatory Visit: Payer: Self-pay

## 2020-02-10 ENCOUNTER — Encounter: Payer: Self-pay | Admitting: Adult Health

## 2020-02-10 DIAGNOSIS — R102 Pelvic and perineal pain unspecified side: Secondary | ICD-10-CM

## 2020-02-10 DIAGNOSIS — R1030 Lower abdominal pain, unspecified: Secondary | ICD-10-CM

## 2020-02-11 ENCOUNTER — Ambulatory Visit: Payer: 59 | Admitting: Internal Medicine

## 2020-02-11 VITALS — BP 104/70 | HR 95 | Temp 97.4°F | Resp 16 | Ht 66.0 in | Wt 136.0 lb

## 2020-02-11 DIAGNOSIS — R197 Diarrhea, unspecified: Secondary | ICD-10-CM

## 2020-02-11 MED ORDER — DICYCLOMINE HCL 20 MG PO TABS: ORAL_TABLET | ORAL | 0 refills | Status: DC

## 2020-02-11 NOTE — Progress Notes (Signed)
   History of Present Illness:     Patient is a very nice 54 yo single WF  With chronic Diarrhea who was maintained a Gluten free diet (Neg celiac tests) w/o improvement in her sx's. GI pathogen panel was Negative. CRP 28.2 and ESR 43 were both elevated. She has had diarrheal [facility] 6 & 7) intermittently and also associated intermittently with bilateral lower abdominal sharp cramping. She denies any apparent blood in her BM's. GI referral to [facility] pending.   Medications   .  estradiol-norethindrone (COMBIPATCH) 0.05-0.14 MG/DAY, Place 1 patch onto the skin 2 (two) times a week.  Marland Kitchen  EPINEPHrine , Inject 0.3 mLs  into the muscle as needed for anaphylaxis. .  fenofibrate  145 MG tablet, Take one tablet daily  .  spironolactone 100 MG tablet, TAKE 1 AND 1/2 TABLETS DAILY .  levocetirizine 5 MG tablet, Take 1 tablet Daily  for Alergies .  diclofenac 75 MG EC tablet, TAKE 1 TABLET TWICE DAILY  .  VITAMIN B-12 , Take by mouth.  .  Boric Acid GRAN, Once as needed .  CALCIUM + D , Take by mouth.  Marland Kitchen  VITAMIN D , Take 5,000 Units by mouth. .  Coenzyme Q10 Take by mouth.  .  hyoscyamine SL 0.125 MG SL tablet, Take 1 to 2 tablets 3 to 4 x day if needed  .  Multiple Vitamin , Take 1 tablet by mouth. Problem list She has Uterine fibroid; Environmental allergies; Hyperlipidemia; Vitamin D deficiency; Acne; Allergic to food; Low back pain; Diverticulosis; Gluten intolerance; and Diarrhea on their problem list.   Observations/Objective:  BP 104/70   P 95   T 97.4 F   R 16   Ht $R'5\' 6"'Zh$     Wt 136 lb   SpO2 96%   BMI 21.95   HEENT - WNL. Neck - supple.  Chest - Clear equal BS. Cor - Nl HS. RRR w/o sig MGR. PP 1(+). No edema. Abd - Soft w/o  Masses or tenderness. BS - Nl.  MS- FROM w/o deformities.  Gait Nl. Neuro -  Nl w/o focal abnormalities.   Assessment and Plan:  1. Diarrhea  - ? IBS vs IBD or occult "colitis"  - dicyclomine (BENTYL) 20 MG tablet; Take      1 tablet     3 x /day       before meals      for GI symptoms  Dispense: 180 tablet; Refill: 0   Follow Up Instructions:      I discussed the assessment and treatment plan with the patient. The patient was provided an opportunity to ask questions and all were answered. The patient agreed with the plan and demonstrated an understanding of the instructions.   Kirtland Bouchard, MD

## 2020-02-12 ENCOUNTER — Ambulatory Visit: Payer: 59 | Admitting: Internal Medicine

## 2020-02-14 ENCOUNTER — Encounter: Payer: Self-pay | Admitting: Internal Medicine

## 2020-07-30 ENCOUNTER — Other Ambulatory Visit: Payer: Self-pay | Admitting: Internal Medicine

## 2020-07-30 MED ORDER — COMBIPATCH 0.05-0.14 MG/DAY TD PTTW: 1.0000 | MEDICATED_PATCH | TRANSDERMAL | 3 refills | Status: DC

## 2020-08-01 ENCOUNTER — Other Ambulatory Visit: Payer: Self-pay | Admitting: Internal Medicine

## 2020-08-02 ENCOUNTER — Telehealth: Payer: Self-pay | Admitting: *Deleted

## 2020-08-02 ENCOUNTER — Encounter: Payer: Self-pay | Admitting: *Deleted

## 2020-08-02 DIAGNOSIS — Z780 Asymptomatic menopausal state: Secondary | ICD-10-CM | POA: Insufficient documentation

## 2020-08-02 NOTE — Telephone Encounter (Signed)
Left message for a return call from patient regarding Combipatch approval, which was done by GYN in 06/2019.

## 2020-08-02 NOTE — Telephone Encounter (Signed)
Combipatch PA submitted to [facility].

## 2020-08-03 ENCOUNTER — Telehealth: Payer: Self-pay | Admitting: *Deleted

## 2020-08-03 NOTE — Telephone Encounter (Signed)
Left message to call regarding other forms of Estrogen the patient has tried.

## 2020-08-03 NOTE — Telephone Encounter (Signed)
Patient returned call and states she tried Estradiol 2 mg tablets, while seeing a GYN in the [facility]. PA for Combipatch resubmitted.

## 2020-08-12 LAB — HM MAMMOGRAPHY

## 2020-08-16 ENCOUNTER — Encounter: Payer: Self-pay | Admitting: *Deleted

## 2020-08-24 LAB — HM MAMMOGRAPHY

## 2020-08-25 ENCOUNTER — Encounter: Payer: Self-pay | Admitting: Anesthesiology

## 2020-09-09 NOTE — Progress Notes (Deleted)
Complete Physical  Assessment and Plan:  Encounter for general adult medical examination with abnormal findings 1 year  GET MGM- number given Check with GI about 5 versus 10 year colonoscopy with new guidelines- number given ***  Hyperlipidemia, unspecified hyperlipidemia type -     Lipid panel check lipids decrease fatty foods increase activity.   Vitamin D deficiency -     VITAMIN D 25 Hydroxy (Vit-D Deficiency, Fractures)  Environmental allergies Monitor  Acne, unspecified acne type On spirolactone- check labs- continue DERM  Chronic right-sided low back pain without sciatica Can follow up with PT/strength and do proper lifting techniques  Uterine fibroid Continue GYN  Allergic to food/ gluten sensitivity Low gluten diet; Monitor ***  Diverticulosis High fiber diet encouraged   Screening for hematuria or proteinuria -     Urinalysis, Routine w reflex microscopic  Screening for thyroid disorder -     TSH  Screening, anemia, deficiency, iron -     Iron,Total/Total Iron Binding Cap -     Ferritin -     Vitamin B12  Medication management -     CBC with Differential/Platelet -     COMPLETE METABOLIC PANEL WITH GFR -     Magnesium  Stress and adjustment reaction No medications at this time, continue counseling, continue self care Follow up if needed ***  Post menopause symptoms *** -     estradiol-norethindrone (COMBIPATCH) 0.05-0.14 MG/DAY; Place 1 patch onto the skin 2 (two) times a week.   Discussed med's effects and SE's. Screening labs and tests as requested with regular follow-up as recommended. Over 40 minutes of exam, counseling, chart review, and critical decision making was performed this visit.   Future Appointments  Date Time Provider [place]  09/12/2020  4:00 PM Liane Comber, NP [facility] None  09/12/2021 11:00 AM Liane Comber, NP [facility] None     HPI  55 y.o. female  presents for a complete physical. She has Uterine  fibroid; Environmental allergies; Hyperlipidemia; Vitamin D deficiency; Acne; Food allergy; Low back pain; Diverticulosis; Gluten intolerance; Diarrhea; and Post-menopausal on their problem list.  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 ***  Her mom was diagnosed with duodenal cancer, and her best friend was diagnosed with cancer,   She is post menopausal, on OTC *** hormonal therapy that she states helps. [facility] with doctor at [facility]. She had pelvic pain in 2021, [place] 02/10/2020 showed uterine fibroids and was recomemdned GYN follow up ***  She has chronic lower back pain and has seen Dr. Nelva Bush in the past, states PT has not helped. Continuing lower back pain, muscular, worse with bending. Patient denies fever, hematuria, incontinence, numbness, tingling, weakness and saddle anesthesia  She is on spirolactone for her acne and follows with Dr. Ubaldo Glassing.   She also has stopped gluten that has helped her with her allergies and thinks she may have gluten allergy/celiac, has had negative celiac panels. Was referred back to [facility] last year due to due for screening colonoscopy but hasn't completed ***  BMI is There is no height or weight on file to calculate BMI., she {HAS HAS [id number]} been working on diet and exercise. Wt Readings from Last 3 Encounters:  02/11/20 136 lb (61.7 kg)  02/02/20 136 lb 6.4 oz (61.9 kg)  12/30/19 136 lb (61.7 kg)   Her blood pressure has been controlled at home, today their BP is   She does not workout due to  lower back pain.  She denies chest pain, shortness of breath, dizziness.   She is not on cholesterol medication and denies myalgias. Her cholesterol is at goal. The cholesterol last visit was:   Lab Results  Component Value Date   CHOL 180 09/10/2019   HDL 58 09/10/2019   LDLCALC 101 (H) 09/10/2019   TRIG 115 09/10/2019   CHOLHDL 3.1 09/10/2019   ** No results found for: HGBA1C   Patient is not on Vitamin D  supplement Lab Results  Component Value Date   VD25OH 60 09/10/2019   CBC Latest Ref Rng & Units 12/30/2019 09/10/2019 07/23/2017  WBC 3.8 - 10.8 Thousand/uL 10.1 6.6 5.8  Hemoglobin 11.7 - 15.5 g/dL 12.8 13.3 13.9  Hematocrit 35.0 - 45.0 % 37.8 39.2 39.8  Platelets 140 - 400 Thousand/uL 449(H) 376 399   Lab Results  Component Value Date   IRON 95 09/10/2019   TIBC 409 09/10/2019   FERRITIN 56 09/10/2019   Lab Results  Component Value Date   VITAMINB12 726 09/10/2019      Current Medications:  Current Outpatient Medications on File Prior to Visit  Medication Sig  . Boric Acid GRAN Once as needed  . Calcium Carbonate-Vitamin D (CALCIUM + D PO) Take by mouth.   . Cholecalciferol (VITAMIN D PO) Take 5,000 Units by mouth.  . Coenzyme Q10 (CO Q 10 PO) Take by mouth.   . Cyanocobalamin (VITAMIN B-12 PO) Take by mouth.   . dicyclomine (BENTYL) 20 MG tablet Take      1 tablet     3 x /day      before meals      for GI symptoms  . EPINEPHrine 0.3 mg/0.3 mL IJ SOAJ injection Inject 0.3 mLs (0.3 mg total) into the muscle as needed for anaphylaxis.  Marland Kitchen estradiol-norethindrone (COMBIPATCH) 0.05-0.14 MG/DAY Place 1 patch onto the skin 2 (two) times a week.  . fenofibrate (TRICOR) 145 MG tablet Take one tablet daily OR as DIRECTED BY YOUR PROVIDER  . hyoscyamine (LEVSIN SL) 0.125 MG SL tablet Take 1 to 2 tablets 3 to 4 x day if needed for Nausea, vomiting, cramping or diarrhea  . levocetirizine (XYZAL) 5 MG tablet Take 1 tablet Daily  for Alergies  . Multiple Vitamin (THERA) TABS Take 1 tablet by mouth.  . spironolactone (ALDACTONE) 100 MG tablet TAKE 1 AND 1/2 TABLETS(150 MG) BY MOUTH DAILY   No current facility-administered medications on file prior to visit.   Immunization History  Administered Date(s) Administered  . Influenza Split 01/19/2015  . Influenza, Seasonal, Injecte, Preservative Fre 05/02/2016  . Influenza-Unspecified 12/22/2017  . PFIZER(Purple Top)SARS-COV-2 Vaccination  07/04/2019, 07/25/2019, 12/17/2019  . Tdap 05/14/2017    Health Maintenance:   Tetanus: 04/2017 Pneumovax: N/A Flu vaccine: 2019 *** Shingrix: *** Covid 19: 3/3, pfizer  LMP: 2015 post meonpausal Pap: 06/2017 negative HPV *** MGM: 08/12/2020 [employer] DEXA: N/A [place] 02/10/2020 uterine fibroids ***   Colonoscopy: 2016 1 sessile polyp Dr. Henrene Pastor *** EGD: N/A   Last Dental Exam: Dr. Jabier Mutton, q 6 months Last Eye Exam: [facility], glasses, q 1-2 years  Patient Care Team: Unk Pinto, MD as PCP - General (Internal Medicine) Phineas Real, Belinda Block, MD (Inactive) as Consulting Physician (Gynecology) Suella Broad, MD as Consulting Physician (Physical Medicine and Rehabilitation) Rolm Bookbinder, MD as Consulting Physician (Dermatology) Bobbitt, [place] Muta, MD (Inactive) as Consulting Physician (Allergy and Immunology) Irene Shipper, MD as Consulting Physician (Gastroenterology)  Medical History:  Past Medical History:  Diagnosis Date  . Allergy   . Diverticulosis 12/30/2019  . Environmental allergies   . Fibroid   . Hyperlipidemia   . Osteopenia 07/2017   Score -1.5 FRAX 4.6% / 0.2%   Allergies Allergies  Allergen Reactions  . Molds & Smuts Rash    Pt was tested at allergist.   . Gluten Meal     Has been tested and received a "false negative"  . Tuna [Fish Allergy] Itching    Hives; hypotension Also anchovies    SURGICAL HISTORY She  has a past surgical history that includes Appendectomy; Myomectomy; and Hysteroscopy (2007). FAMILY HISTORY Her family history includes Breast cancer in her mother; Cancer in her maternal uncle and mother; Heart disease in her maternal grandmother. SOCIAL HISTORY She  reports that she has never smoked. She has never used smokeless tobacco. She reports current alcohol use. She reports that she does not use drugs.  Review of Systems: Review of Systems  Constitutional: Negative for malaise/fatigue and weight loss.  HENT:  Negative for hearing loss and tinnitus.   Eyes: Negative for blurred vision and double vision.  Respiratory: Negative for cough, shortness of breath and wheezing.   Cardiovascular: Negative for chest pain, palpitations, orthopnea, claudication and leg swelling.  Gastrointestinal: Positive for diarrhea (improved with low gluten diet). Negative for abdominal pain, blood in stool, constipation, heartburn, melena, nausea and vomiting.  Genitourinary: Negative.   Musculoskeletal: Negative for joint pain and myalgias.  Skin: Negative for rash.  Neurological: Negative for dizziness, tingling, sensory change, weakness and headaches.  Endo/Heme/Allergies: Negative for polydipsia.  Psychiatric/Behavioral: Negative.   All other systems reviewed and are negative.   Physical Exam: Estimated body mass index is 21.95 kg/m as calculated from the following:   Height as of 02/11/20: 5\' 6"  (1.676 m).   Weight as of 02/11/20: 136 lb (61.7 kg). There were no vitals taken for this visit. General Appearance: Well nourished, in no apparent distress.  Eyes: PERRLA, EOMs, conjunctiva no swelling or erythema, normal fundi and vessels.  Sinuses: No Frontal/maxillary tenderness  ENT/Mouth: Ext aud canals clear, normal light reflex with TMs without erythema, bulging. Good dentition. No erythema, swelling, or exudate on post pharynx. Tonsils not swollen or erythematous. Hearing normal.  Neck: Supple, thyroid normal. No bruits  Respiratory: Respiratory effort normal, BS equal bilaterally without rales, rhonchi, wheezing or stridor.  Cardio: RRR without murmurs, rubs or gallops. Brisk peripheral pulses without edema.  Chest: symmetric, with normal excursions and percussion.  Breasts: defer  Abdomen: Soft, nontender, no guarding, rebound, hernias, masses, or organomegaly.  Lymphatics: Non tender without lymphadenopathy.  Genitourinary: defer *** Musculoskeletal: Full ROM all peripheral extremities,5/5 strength, and  normal gait.  Skin: Warm, dry without rashes, lesions, ecchymosis. Neuro: Cranial nerves intact, reflexes equal bilaterally. Normal muscle tone, no cerebellar symptoms. Sensation intact.  Psych: Awake and oriented X 3, normal affect, Insight and Judgment appropriate.   EKG: WNL, IRBBB, no ST changes in 2021 ***  Gorden Harms Treg Diemer 1:58 PM [facility]

## 2020-09-12 ENCOUNTER — Encounter: Payer: Self-pay | Admitting: Adult Health

## 2020-09-12 DIAGNOSIS — Z0000 Encounter for general adult medical examination without abnormal findings: Secondary | ICD-10-CM

## 2020-09-12 DIAGNOSIS — E559 Vitamin D deficiency, unspecified: Secondary | ICD-10-CM

## 2020-09-12 DIAGNOSIS — Z131 Encounter for screening for diabetes mellitus: Secondary | ICD-10-CM

## 2020-09-12 DIAGNOSIS — Z79899 Other long term (current) drug therapy: Secondary | ICD-10-CM

## 2020-09-12 DIAGNOSIS — K5790 Diverticulosis of intestine, part unspecified, without perforation or abscess without bleeding: Secondary | ICD-10-CM

## 2020-09-12 DIAGNOSIS — D251 Intramural leiomyoma of uterus: Secondary | ICD-10-CM

## 2020-09-12 DIAGNOSIS — E785 Hyperlipidemia, unspecified: Secondary | ICD-10-CM

## 2020-09-12 DIAGNOSIS — Z1329 Encounter for screening for other suspected endocrine disorder: Secondary | ICD-10-CM

## 2020-09-12 DIAGNOSIS — L709 Acne, unspecified: Secondary | ICD-10-CM

## 2020-09-12 DIAGNOSIS — R197 Diarrhea, unspecified: Secondary | ICD-10-CM

## 2020-09-12 DIAGNOSIS — Z1389 Encounter for screening for other disorder: Secondary | ICD-10-CM

## 2020-09-12 DIAGNOSIS — G8929 Other chronic pain: Secondary | ICD-10-CM

## 2020-09-14 ENCOUNTER — Encounter: Payer: Self-pay | Admitting: *Deleted

## 2020-09-30 ENCOUNTER — Other Ambulatory Visit: Payer: Self-pay

## 2020-09-30 ENCOUNTER — Other Ambulatory Visit: Payer: Self-pay | Admitting: Internal Medicine

## 2020-09-30 DIAGNOSIS — E785 Hyperlipidemia, unspecified: Secondary | ICD-10-CM

## 2020-09-30 DIAGNOSIS — Z9109 Other allergy status, other than to drugs and biological substances: Secondary | ICD-10-CM

## 2020-09-30 MED ORDER — FENOFIBRATE 145 MG PO TABS: ORAL_TABLET | ORAL | 3 refills | Status: DC

## 2020-12-21 DIAGNOSIS — M8588 Other specified disorders of bone density and structure, other site: Secondary | ICD-10-CM | POA: Insufficient documentation

## 2020-12-21 NOTE — Progress Notes (Signed)
Complete Physical  Assessment and Plan:  Encounter for general adult medical examination with abnormal findings 1 year Continue annual mammogram, DEXA with next Declines GI follow up this year, would consider next year  Hyperlipidemia, unspecified hyperlipidemia type -     Lipid panel check lipids decrease fatty foods increase activity.   Vitamin D deficiency -     VITAMIN D 25 Hydroxy (Vit-D Deficiency, Fractures)  Environmental allergies Monitor  Acne, unspecified acne type On spirolactone- check labs - continue DERM  Chronic right-sided low back pain without sciatica Can follow up with PT/strength and do proper lifting techniques  Fibroid Continue GYN  Allergic to food Monitor, avoids gluten, celiac panel was negative  Screening for hematuria or proteinuria -     Urinalysis, Routine w reflex microscopic  Screening for thyroid disorder/ family hx of thyroid disorder -     TSH  Medication management -     CBC with Differential/Platelet -     COMPLETE METABOLIC PANEL WITH GFR -     Magnesium  Osteopenia - get dexa with next mammogram, continue Vit D and Ca, weight bearing exercises  Vaginal odor Hx of BV - Wet prep  Stress and adjustment reaction/ depression Start new medication as prescribed - lexapro 5 mg x 2 weeks then 10 mg daily Stress management techniques discussed, increase water, good sleep hygiene discussed, increase exercise, and increase veggies.  Follow up 10-12 weeks or sooner if needed, call the office if any new AE's from medications and we will switch them - continue counseling, continue self care  Post menopause symptoms -     estradiol-norethindrone (COMBIPATCH) 0.05-0.14 MG/DAY; Place 1 patch onto the skin 2 (two) times a week.  Orders Placed This Encounter  Procedures   WET PREP BY MOLECULAR PROBE   DG Bone Density   CBC with Differential/Platelet   COMPLETE METABOLIC PANEL WITH GFR   Magnesium   Lipid panel   TSH   VITAMIN D  25 Hydroxy (Vit-D Deficiency, Fractures)   Urinalysis w microscopic + reflex cultur    Discussed med's effects and SE's. Screening labs and tests as requested with regular follow-up as recommended. Over 40 minutes of exam, counseling, chart review, and critical decision making was performed this visit.   Future Appointments  Date Time Provider [place]  12/26/2021  9:00 AM Liane Comber, NP [facility] None     HPI  55 y.o. female  presents for a complete physical. She has Environmental allergies; Hyperlipidemia; Vitamin D deficiency; Acne; Food allergy; Low back pain; Diverticulosis; Gluten intolerance; Diarrhea; Post-menopausal; and Osteopenia of lumbar spine on their problem list.  She is single, never married/no children. Not currently dating. She is currently studying interior Academic librarian, has degree in interior architecture. Lost job over the pandemic.   She has had a rough year emotionally, mother has advancing duodenal cancer and not doing well. She report feeling pressure to care for her and a lot of guilt. Several deaths in recent years, uncle, grandmother and friend. 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 She has done some therapy/grief counseling, currently managing with meditation, prayers. She has taken zoloft in the past, interested in starting back on medication.   She is post menopausal, on OTC hormonal therapy for hot flashes vir GYN at [facility] but hasn't followed up since 2019. Not sexually active, last PAP was HPV neg in 2019. Getting regular mammorgrams at [facility]. We are prescribing in  the interim.   She has chronic lower back pain and has seen Dr. Nelva Bush in the past.   She is on spirolactone for her acne and follows with Dr. Ubaldo Glassing, requests potassium check annually here.   In 2021 she had abdominal pain followed by persistent diarrhea for 6 months, rapid covid 19 and GI pathogen panel was Negative. CRP  28.2 and ESR 43 were both elevated. She had negative celiac panel. She was referred to [facility] but colonoscopy/EGD was going to cost $1000 for facility charge fee. She reports sx abruptly resolved after about 6 months and declined further workup.   BMI is Body mass index is 22.11 kg/m., she has been working on diet and exercise. Wt Readings from Last 3 Encounters:  12/22/20 137 lb (62.1 kg)  02/11/20 136 lb (61.7 kg)  02/02/20 136 lb 6.4 oz (61.9 kg)   Her blood pressure has been controlled at home, today their BP is BP: 102/68 She does not workout due to lower back pain.  She denies chest pain, shortness of breath, dizziness.   She is on cholesterol medication (fenofibrate) and denies myalgias. Her cholesterol is at goal. The cholesterol last visit was:   Lab Results  Component Value Date   CHOL 180 09/10/2019   HDL 58 09/10/2019   LDLCALC 101 (H) 09/10/2019   TRIG 115 09/10/2019   CHOLHDL 3.1 09/10/2019    Patient is not on Vitamin D supplement, 5000 IU daily.  Lab Results  Component Value Date   VD25OH 60 09/10/2019      Current Medications:  Current Outpatient Medications on File Prior to Visit  Medication Sig   Cholecalciferol (VITAMIN D PO) Take 5,000 Units by mouth.   Cyanocobalamin (VITAMIN B-12 PO) Take by mouth.    EPINEPHrine 0.3 mg/0.3 mL IJ SOAJ injection Inject 0.3 mLs (0.3 mg total) into the muscle as needed for anaphylaxis.   estradiol-norethindrone (COMBIPATCH) 0.05-0.14 MG/DAY Place 1 patch onto the skin 2 (two) times a week.   fenofibrate (TRICOR) 145 MG tablet Take one tablet daily OR as DIRECTED BY YOUR PROVIDER   levocetirizine (XYZAL) 5 MG tablet TAKE 1 TABLET BY MOUTH DAILY FOR ALLERGIES   Multiple Vitamin (THERA) TABS Take 1 tablet by mouth.   spironolactone (ALDACTONE) 100 MG tablet TAKE 1 AND 1/2 TABLETS(150 MG) BY MOUTH DAILY   Boric Acid GRAN Once as needed (Patient not taking: Reported on 12/22/2020)   Calcium Carbonate-Vitamin D (CALCIUM + D PO)  Take by mouth.  (Patient not taking: Reported on 12/22/2020)   Coenzyme Q10 (CO Q 10 PO) Take by mouth.  (Patient not taking: Reported on 12/22/2020)   dicyclomine (BENTYL) 20 MG tablet Take      1 tablet     3 x /day      before meals      for GI symptoms (Patient not taking: Reported on 12/22/2020)   hyoscyamine (LEVSIN SL) 0.125 MG SL tablet Take 1 to 2 tablets 3 to 4 x day if needed for Nausea, vomiting, cramping or diarrhea (Patient not taking: Reported on 12/22/2020)   No current facility-administered medications on file prior to visit.   Immunization History  Administered Date(s) Administered   Influenza Split 01/19/2015   Influenza, Seasonal, Injecte, Preservative Fre 05/02/2016   Influenza-Unspecified 12/22/2017   PFIZER(Purple Top)SARS-COV-2 Vaccination 07/04/2019, 07/25/2019, 12/17/2019   Tdap 05/14/2017    Health Maintenance:   Tetanus: 04/2017 Pneumovax: N/A Prevnar 13: N/A Flu vaccine: 2018 Shingrix: Ask insurance about shingrix Covid  19: 2/2, pfizer + booster this year, will call with date  LMP: 2015 post meonpausal Pap: 06/2017 negative HPV, GYN MGM: 08/2020  DEXA: 07/2017, Spine T score -1.4, osteopenia  Colonoscopy: 2016 1 sessile polyp, Dr. Henrene Pastor, cost was barrier, declined this year EGD: N/A  Last Dental Exam: Dr. Garry Heater, q 6 months Last Eye Exam: [facility], glasses, q 1-2 years Last derm: Dr. Ubaldo Glassing, goes annually   Patient Care Team: Unk Pinto, MD as PCP - General (Internal Medicine) Phineas Real, Belinda Block, MD (Inactive) as Consulting Physician (Gynecology) Suella Broad, MD as Consulting Physician (Physical Medicine and Rehabilitation) Rolm Bookbinder, MD as Consulting Physician (Dermatology) Bobbitt, Sedalia Muta, MD (Inactive) as Consulting Physician (Allergy and Immunology) Irene Shipper, MD as Consulting Physician (Gastroenterology)  Medical History:  Past Medical History:  Diagnosis Date   Allergy    Diverticulosis  12/30/2019   Environmental allergies    Fibroid    Hyperlipidemia    Osteopenia 07/2017   Score -1.5 FRAX 4.6% / 0.2%   Uterine fibroid    Allergies Allergies  Allergen Reactions   Molds & Smuts Rash    Pt was tested at allergist.    Gluten Meal     Has been tested and received a "false negative"   Jordan [Fish Allergy] Itching    Hives; hypotension Also anchovies    SURGICAL HISTORY She  has a past surgical history that includes Appendectomy; Myomectomy; and Hysteroscopy (2007). FAMILY HISTORY Her family history includes Breast cancer in her mother; Cancer in her maternal uncle and mother; Heart disease in her maternal grandmother; Hyperlipidemia in her maternal grandmother; Hypertension in her maternal grandmother. SOCIAL HISTORY She  reports that she has never smoked. She has never used smokeless tobacco. She reports current alcohol use. She reports that she does not use drugs.  Review of Systems: Review of Systems  Constitutional:  Negative for malaise/fatigue and weight loss.  HENT:  Negative for hearing loss and tinnitus.   Eyes:  Negative for blurred vision and double vision.  Respiratory:  Negative for cough, sputum production, shortness of breath and wheezing.   Cardiovascular:  Negative for chest pain, palpitations, orthopnea, claudication, leg swelling and PND.  Gastrointestinal:  Negative for abdominal pain, blood in stool, constipation, diarrhea, heartburn, melena, nausea and vomiting.  Genitourinary: Negative.        Vaginal odor without itching or discharge  Musculoskeletal:  Negative for falls, joint pain and myalgias.  Skin:  Negative for rash.  Neurological:  Negative for dizziness, tingling, sensory change, weakness and headaches.  Endo/Heme/Allergies:  Negative for polydipsia.  Psychiatric/Behavioral:  Positive for depression. Negative for memory loss, substance abuse and suicidal ideas. The patient is not nervous/anxious and does not have insomnia.   All  other systems reviewed and are negative.  Physical Exam: Estimated body mass index is 22.11 kg/m as calculated from the following:   Height as of this encounter: '5\' 6"'  (1.676 m).   Weight as of this encounter: 137 lb (62.1 kg). BP 102/68   Pulse 79   Temp (!) 97.3 F (36.3 C)   Ht '5\' 6"'  (1.676 m)   Wt 137 lb (62.1 kg)   SpO2 99%   BMI 22.11 kg/m  General Appearance: Well nourished, in no apparent distress.  Eyes: PERRLA, EOMs, conjunctiva no swelling or erythema, normal fundi and vessels.  Sinuses: No Frontal/maxillary tenderness  ENT/Mouth: Ext aud canals clear, normal light reflex with TMs without erythema, bulging. Good dentition. No erythema, swelling,  or exudate on post pharynx. Tonsils not swollen or erythematous. Hearing normal.  Neck: Supple, thyroid normal. No bruits  Respiratory: Respiratory effort normal, BS equal bilaterally without rales, rhonchi, wheezing or stridor.  Cardio: RRR without murmurs, rubs or gallops. Brisk peripheral pulses without edema.  Chest: symmetric, with normal excursions and percussion.  Breasts: defer  Abdomen: Soft, nontender, no guarding, rebound, hernias, masses, or organomegaly.  Lymphatics: Non tender without lymphadenopathy.  Genitourinary: defer Musculoskeletal: Full ROM all peripheral extremities,5/5 strength, and normal gait.  Skin: Warm, dry without rashes, lesions, ecchymosis. Neuro: Cranial nerves intact, reflexes equal bilaterally. Normal muscle tone, no cerebellar symptoms. Sensation intact.  Psych: Awake and oriented X 3, depressed affect, Insight and Judgment appropriate.   EKG: WNL, IRBBB, no ST changes in 2021, defer today  Izora Ribas 9:37 AM [facility]

## 2020-12-22 ENCOUNTER — Other Ambulatory Visit: Payer: Self-pay

## 2020-12-22 ENCOUNTER — Encounter: Payer: Self-pay | Admitting: Adult Health

## 2020-12-22 ENCOUNTER — Ambulatory Visit (INDEPENDENT_AMBULATORY_CARE_PROVIDER_SITE_OTHER): Payer: 59 | Admitting: Adult Health

## 2020-12-22 VITALS — BP 102/68 | HR 79 | Temp 97.3°F | Ht 66.0 in | Wt 137.0 lb

## 2020-12-22 DIAGNOSIS — Z6821 Body mass index (BMI) 21.0-21.9, adult: Secondary | ICD-10-CM

## 2020-12-22 DIAGNOSIS — Z780 Asymptomatic menopausal state: Secondary | ICD-10-CM

## 2020-12-22 DIAGNOSIS — E785 Hyperlipidemia, unspecified: Secondary | ICD-10-CM

## 2020-12-22 DIAGNOSIS — Z1329 Encounter for screening for other suspected endocrine disorder: Secondary | ICD-10-CM

## 2020-12-22 DIAGNOSIS — Z0000 Encounter for general adult medical examination without abnormal findings: Secondary | ICD-10-CM

## 2020-12-22 DIAGNOSIS — E559 Vitamin D deficiency, unspecified: Secondary | ICD-10-CM

## 2020-12-22 DIAGNOSIS — Z0001 Encounter for general adult medical examination with abnormal findings: Secondary | ICD-10-CM

## 2020-12-22 DIAGNOSIS — Z1389 Encounter for screening for other disorder: Secondary | ICD-10-CM

## 2020-12-22 DIAGNOSIS — Z13228 Encounter for screening for other metabolic disorders: Secondary | ICD-10-CM

## 2020-12-22 DIAGNOSIS — Z79899 Other long term (current) drug therapy: Secondary | ICD-10-CM

## 2020-12-22 DIAGNOSIS — F324 Major depressive disorder, single episode, in partial remission: Secondary | ICD-10-CM | POA: Insufficient documentation

## 2020-12-22 DIAGNOSIS — D259 Leiomyoma of uterus, unspecified: Secondary | ICD-10-CM

## 2020-12-22 DIAGNOSIS — Z13220 Encounter for screening for lipoid disorders: Secondary | ICD-10-CM

## 2020-12-22 DIAGNOSIS — Z130 Encounter for screening for diseases of the blood and blood-forming organs and certain disorders involving the immune mechanism: Secondary | ICD-10-CM

## 2020-12-22 DIAGNOSIS — M8588 Other specified disorders of bone density and structure, other site: Secondary | ICD-10-CM

## 2020-12-22 DIAGNOSIS — N898 Other specified noninflammatory disorders of vagina: Secondary | ICD-10-CM

## 2020-12-22 DIAGNOSIS — K5790 Diverticulosis of intestine, part unspecified, without perforation or abscess without bleeding: Secondary | ICD-10-CM

## 2020-12-22 DIAGNOSIS — F329 Major depressive disorder, single episode, unspecified: Secondary | ICD-10-CM

## 2020-12-22 DIAGNOSIS — Z9109 Other allergy status, other than to drugs and biological substances: Secondary | ICD-10-CM

## 2020-12-22 DIAGNOSIS — K9041 Non-celiac gluten sensitivity: Secondary | ICD-10-CM

## 2020-12-22 MED ORDER — ESCITALOPRAM OXALATE 10 MG PO TABS
10.0000 mg | ORAL_TABLET | Freq: Every day | ORAL | 0 refills | Status: DC
Start: 2020-12-22 — End: 2021-03-08

## 2020-12-22 NOTE — Patient Instructions (Signed)
Erica [name] , Thank you for taking time to come for your Annual Wellness Visit. I appreciate your ongoing commitment to your health goals. Please review the following plan we discussed and let me know if I can assist you in the future.   This is a list of the screening recommended for you and due dates:  Health Maintenance  Topic Date Due   Zoster (Shingles) Vaccine (1 of 2) Never done   COVID-19 Vaccine (4 - Booster for Pfizer series) 03/18/2020   Flu Shot  01/21/2021*   Colon Cancer Screening  12/22/2021*   Pneumococcal Vaccination (1 - PCV) 12/23/2027*   Pap Smear  07/17/2022   Mammogram  08/25/2022   Tetanus Vaccine  05/15/2027   HPV Vaccine  Aged Out   Hepatitis C Screening: USPSTF Recommendation to screen - Ages 18-79 yo.  Discontinued   HIV Screening  Discontinued  *Topic was postponed. The date shown is not the original due date.   Please check with insurance about shingrix - can get shots done at pharmacy   Please send [place] covid 19 booster date for rechords    Know what a healthy weight is for you (roughly BMI <25) and aim to maintain this  Aim for 7+ servings of fruits and vegetables daily  65-80+ fluid ounces of water or unsweet tea for healthy kidneys  Limit to max 1 drink of alcohol per day; avoid smoking/tobacco  Limit animal fats in diet for cholesterol and heart health - choose grass fed whenever available  Avoid highly processed foods, and foods high in saturated/trans fats  Aim for low stress - take time to unwind and care for your mental health  Aim for 150 min of moderate intensity exercise weekly for heart health, and weights twice weekly for bone health  Aim for 7-9 hours of sleep daily    Escitalopram Tablets What is this medication? ESCITALOPRAM (es sye TAL oh pram) treats depression and anxiety. It increases the amount of serotonin in the brain, a hormone that helps regulate mood. It belongs to a group of medications called SSRIs. This  medicine may be used for other purposes; ask your health care provider or pharmacist if you have questions. COMMON BRAND NAME(S): Lexapro What should I tell my care team before I take this medication? They need to know if you have any of these conditions: Bipolar disorder or a family history of bipolar disorder Diabetes Glaucoma Heart disease Kidney or liver disease Receiving electroconvulsive therapy Seizures Suicidal thoughts, plans, or attempt by you or a family member An unusual or allergic reaction to escitalopram, the related medication citalopram, other medications, foods, dyes, or preservatives Pregnant or trying to become pregnant Breast-feeding How should I use this medication? Take this medication by mouth with a glass of water. Follow the directions on the prescription label. You can take it with or without food. If it upsets your stomach, take it with food. Take your medication at regular intervals. Do not take it more often than directed. Do not stop taking this medication suddenly except upon the advice of your care team. Stopping this medication too quickly may cause serious side effects or your condition may worsen. A special MedGuide will be given to you by the pharmacist with each prescription and refill. Be sure to read this information carefully each time. Talk to your care team regarding the use of this medication in children. Special care may be needed. Overdosage: If you think you have taken too much of this  medicine contact a poison control center or emergency room at once. NOTE: This medicine is only for you. Do not share this medicine with others. What if I miss a dose? If you miss a dose, take it as soon as you can. If it is almost time for your next dose, take only that dose. Do not take double or extra doses. What may interact with this medication? Do not take this medication with any of the following: Certain medications for fungal infections like fluconazole,  itraconazole, ketoconazole, posaconazole, voriconazole Cisapride Citalopram Dronedarone Linezolid MAOIs like Carbex, Eldepryl, Marplan, Nardil, and Parnate Methylene blue (injected into a vein) Pimozide Thioridazine This medication may also interact with the following: Alcohol Amphetamines Aspirin and aspirin-like medications Carbamazepine Certain medications for depression, anxiety, or psychotic disturbances Certain medications for migraine headache like almotriptan, eletriptan, frovatriptan, naratriptan, rizatriptan, sumatriptan, zolmitriptan Certain medications for sleep Certain medications that treat or prevent blood clots like warfarin, enoxaparin, dalteparin Cimetidine Diuretics Dofetilide Fentanyl Furazolidone Isoniazid Lithium Metoprolol NSAIDs, medications for pain and inflammation, like ibuprofen or naproxen Other medications that prolong the QT interval (cause an abnormal heart rhythm) Procarbazine Rasagiline Supplements like St. John's wort, kava kava, valerian Tramadol Tryptophan Ziprasidone This list may not describe all possible interactions. Give your health care provider a list of all the medicines, herbs, non-prescription drugs, or dietary supplements you use. Also tell them if you smoke, drink alcohol, or use illegal drugs. Some items may interact with your medicine. What should I watch for while using this medication? Tell your care team if your symptoms do not get better or if they get worse. Visit your care team for regular checks on your progress. Because it may take several weeks to see the full effects of this medication, it is important to continue your treatment as prescribed by your care team. Watch for new or worsening thoughts of suicide or depression. This includes sudden changes in mood, behaviors, or thoughts. These changes can happen at any time but are more common in the beginning of treatment or after a change in dose. Call your care team right  away if you experience these thoughts or worsening depression. Manic episodes may happen in patients with bipolar disorder who take this medication. Watch for changes in feelings or behaviors such as feeling anxious, nervous, agitated, panicky, irritable, hostile, aggressive, impulsive, severely restless, overly excited and hyperactive, or trouble sleeping. These symptoms can happen at any time but are more common in the beginning of treatment or after a change in dose. Call your care team right away if you notice any of these symptoms. You may get drowsy or dizzy. Do not drive, use machinery, or do anything that needs mental alertness until you know how this medication affects you. Do not stand or sit up quickly, especially if you are an older patient. This reduces the risk of dizzy or fainting spells. Alcohol may interfere with the effect of this medication. Avoid alcoholic drinks. Your mouth may get dry. Chewing sugarless gum or sucking hard candy, and drinking plenty of water may help. Contact your care team if the problem does not go away or is severe. What side effects may I notice from receiving this medication? Side effects that you should report to your care team as soon as possible: Allergic reactions-skin rash, itching, hives, swelling of the face, lips, tongue, or throat Bleeding-bloody or black, tar-like stools, red or dark brown urine, vomiting blood or brown material that looks like coffee grounds, small, red or purple  spots on skin, unusual bleeding or bruising Heart rhythm changes-fast or irregular heartbeat, dizziness, feeling faint or lightheaded, chest pain, trouble breathing Low sodium level-muscle weakness, fatigue, dizziness, headache, confusion Serotonin syndrome-irritability, confusion, fast or irregular heartbeat, muscle stiffness, twitching muscles, sweating, high fever, seizure, chills, vomiting, diarrhea Sudden eye pain or change in vision such as blurry vision, seeing halos  around lights, vision loss Thoughts of suicide or self-harm, worsening mood, feelings of depression Side effects that usually do not require medical attention (report to your care team if they continue or are bothersome): Change in sex drive or performance Diarrhea Excessive sweating Nausea Tremors or shaking Upset stomach This list may not describe all possible side effects. Call your doctor for medical advice about side effects. You may report side effects to FDA at 1-800-FDA-1088. Where should I keep my medication? Keep out of reach of children and pets. Store at room temperature between 15 and 30 degrees C (59 and 86 degrees F). Throw away any unused medication after the expiration date. NOTE: This sheet is a summary. It may not cover all possible information. If you have questions about this medicine, talk to your doctor, pharmacist, or health care provider.  2022 Elsevier/Gold Standard (2020-02-29 09:53:34)

## 2020-12-23 ENCOUNTER — Other Ambulatory Visit: Payer: Self-pay | Admitting: Adult Health

## 2020-12-23 LAB — WET PREP BY MOLECULAR PROBE
Candida species: DETECTED — AB
Gardnerella vaginalis: NOT DETECTED
MICRO NUMBER:: 12321699
SPECIMEN QUALITY:: ADEQUATE
Trichomonas vaginosis: NOT DETECTED

## 2020-12-23 LAB — CBC WITH DIFFERENTIAL/PLATELET
Absolute Monocytes: 370 cells/uL (ref 200–950)
Basophils Absolute: 50 cells/uL (ref 0–200)
Basophils Relative: 1 %
Eosinophils Absolute: 150 cells/uL (ref 15–500)
Eosinophils Relative: 3 %
HCT: 42.4 % (ref 35.0–45.0)
Hemoglobin: 14.5 g/dL (ref 11.7–15.5)
Lymphs Abs: 1515 cells/uL (ref 850–3900)
MCH: 32.2 pg (ref 27.0–33.0)
MCHC: 34.2 g/dL (ref 32.0–36.0)
MCV: 94.2 fL (ref 80.0–100.0)
MPV: 10.5 fL (ref 7.5–12.5)
Monocytes Relative: 7.4 %
Neutro Abs: 2915 cells/uL (ref 1500–7800)
Neutrophils Relative %: 58.3 %
Platelets: 372 10*3/uL (ref 140–400)
RBC: 4.5 10*6/uL (ref 3.80–5.10)
RDW: 11.8 % (ref 11.0–15.0)
Total Lymphocyte: 30.3 %
WBC: 5 10*3/uL (ref 3.8–10.8)

## 2020-12-23 LAB — URINALYSIS W MICROSCOPIC + REFLEX CULTURE
Bilirubin Urine: NEGATIVE
Glucose, UA: NEGATIVE
Hgb urine dipstick: NEGATIVE
Hyaline Cast: NONE SEEN /LPF
Ketones, ur: NEGATIVE
Leukocyte Esterase: NEGATIVE
Nitrites, Initial: NEGATIVE
Protein, ur: NEGATIVE
RBC / HPF: NONE SEEN /HPF (ref 0–2)
Specific Gravity, Urine: 1.013 (ref 1.001–1.035)
pH: 6 (ref 5.0–8.0)

## 2020-12-23 LAB — COMPLETE METABOLIC PANEL WITH GFR
AG Ratio: 1.8 (calc) (ref 1.0–2.5)
ALT: 16 U/L (ref 6–29)
AST: 19 U/L (ref 10–35)
Albumin: 4.9 g/dL (ref 3.6–5.1)
Alkaline phosphatase (APISO): 63 U/L (ref 37–153)
BUN: 14 mg/dL (ref 7–25)
CO2: 27 mmol/L (ref 20–32)
Calcium: 10.3 mg/dL (ref 8.6–10.4)
Chloride: 101 mmol/L (ref 98–110)
Creat: 0.81 mg/dL (ref 0.50–1.03)
Globulin: 2.8 g/dL (calc) (ref 1.9–3.7)
Glucose, Bld: 76 mg/dL (ref 65–99)
Potassium: 4.6 mmol/L (ref 3.5–5.3)
Sodium: 139 mmol/L (ref 135–146)
Total Bilirubin: 0.4 mg/dL (ref 0.2–1.2)
Total Protein: 7.7 g/dL (ref 6.1–8.1)
eGFR: 85 mL/min/{1.73_m2} (ref >=60)

## 2020-12-23 LAB — LIPID PANEL
Cholesterol: 234 mg/dL — ABNORMAL HIGH (ref <200)
HDL: 61 mg/dL (ref >=50)
LDL Cholesterol (Calc): 153 mg/dL (calc) — ABNORMAL HIGH
Non-HDL Cholesterol (Calc): 173 mg/dL (calc) — ABNORMAL HIGH (ref <130)
Total CHOL/HDL Ratio: 3.8 (calc) (ref <5.0)
Triglycerides: 91 mg/dL (ref <150)

## 2020-12-23 LAB — TSH: TSH: 1.57 mIU/L (ref 0.40–4.50)

## 2020-12-23 LAB — VITAMIN D 25 HYDROXY (VIT D DEFICIENCY, FRACTURES): Vit D, 25-Hydroxy: 27 ng/mL — ABNORMAL LOW (ref 30–100)

## 2020-12-23 LAB — MAGNESIUM: Magnesium: 2.1 mg/dL (ref 1.5–2.5)

## 2020-12-23 LAB — NO CULTURE INDICATED

## 2020-12-23 MED ORDER — FLUCONAZOLE 150 MG PO TABS: 150.0000 mg | ORAL_TABLET | Freq: Once | ORAL | 3 refills | Status: AC

## 2021-01-15 ENCOUNTER — Other Ambulatory Visit: Payer: Self-pay | Admitting: Adult Health

## 2021-01-15 DIAGNOSIS — L709 Acne, unspecified: Secondary | ICD-10-CM

## 2021-01-16 ENCOUNTER — Other Ambulatory Visit: Payer: Self-pay | Admitting: Internal Medicine

## 2021-02-08 ENCOUNTER — Encounter: Payer: Self-pay | Admitting: Adult Health

## 2021-02-08 ENCOUNTER — Ambulatory Visit (INDEPENDENT_AMBULATORY_CARE_PROVIDER_SITE_OTHER): Payer: 59 | Admitting: Adult Health

## 2021-02-08 ENCOUNTER — Other Ambulatory Visit: Payer: Self-pay

## 2021-02-08 VITALS — BP 108/68 | HR 78 | Temp 97.7°F | Wt 145.0 lb

## 2021-02-08 DIAGNOSIS — N390 Urinary tract infection, site not specified: Secondary | ICD-10-CM | POA: Diagnosis not present

## 2021-02-08 MED ORDER — SULFAMETHOXAZOLE-TRIMETHOPRIM 800-160 MG PO TABS: 1.0000 | ORAL_TABLET | Freq: Two times a day (BID) | ORAL | 0 refills | Status: DC

## 2021-02-08 MED ORDER — FLUCONAZOLE 150 MG PO TABS: ORAL_TABLET | ORAL | 0 refills | Status: DC

## 2021-02-08 NOTE — Progress Notes (Signed)
Assessment and Plan:  Tiyonna was seen today for urinary tract infection.  Diagnoses and all orders for this visit:  Urinary tract infection without hematuria, site unspecified Initiate presumptive treatment due to pain and classic sx pending culture Medications: TMP/SMX. Maintain adequate hydration. Start yeast treatment after completion of abx Follow up if symptoms not improving, and as needed. -     Urinalysis w microscopic + reflex culture -     sulfamethoxazole-trimethoprim (BACTRIM DS) 800-160 MG tablet; Take 1 tablet by mouth 2 (two) times daily. -     fluconazole (DIFLUCAN) 150 MG tablet; Take 1 tab weekly for 3 weeks.  Further disposition pending results of labs. Discussed med's effects and SE's.   Over 30 minutes of exam, counseling, chart review, and critical decision making was performed.   Future Appointments  Date Time Provider [place]  03/08/2021 11:00 AM Liane Comber, NP [facility] None  12/26/2021  9:00 AM Liane Comber, NP [facility] None    ------------------------------------------------------------------------------------------------------------------   HPI BP 108/68   Pulse 78   Temp 97.7 F (36.5 C)   Wt 145 lb (65.8 kg)   SpO2 99%   BMI 23.40 kg/m  55 y.o.female presents for evaluation of possible UTI.   She reports sx gradually began 2-3 days with dysuria, progressive and much worse since last night. Continuous frequency. She has some odor, unsure if vaginal or with urine. Denies hematuria, fever/chills. Notes mild back aching.   Denies vaginal discharge, itching or burning. She was however treated for vaginal yeast 6 weeks ago, has noted persistent vaginal odor since that time. She is not sexually active, no hx of STIs.   Mom has advanced cancer. She started on lexapro 6 weeks ago for depression, taking in AM and reports sedation with this. Suggested she try taking in PM but if no improvement contact office, will switch agent.   Past  Medical History:  Diagnosis Date   Allergy    Diverticulosis 12/30/2019   Environmental allergies    Fibroid    Hyperlipidemia    Osteopenia 07/2017   Score -1.5 FRAX 4.6% / 0.2%   Uterine fibroid      Allergies  Allergen Reactions   Molds & Smuts Rash    Pt was tested at allergist.    Gluten Meal     Has been tested and received a "false negative"   Jordan [Fish Allergy] Itching    Hives; hypotension Also anchovies    Current Outpatient Medications on File Prior to Visit  Medication Sig   Cholecalciferol (VITAMIN D PO) Take 5,000 Units by mouth.   Cyanocobalamin (VITAMIN B-12 PO) Take by mouth.    EPINEPHrine 0.3 mg/0.3 mL IJ SOAJ injection Inject 0.3 mLs (0.3 mg total) into the muscle as needed for anaphylaxis.   escitalopram (LEXAPRO) 10 MG tablet Take 1 tablet (10 mg total) by mouth daily.   estradiol-norethindrone (COMBIPATCH) 0.05-0.14 MG/DAY Place 1 patch onto the skin 2 (two) times a week.   fenofibrate (TRICOR) 145 MG tablet Take one tablet daily OR as DIRECTED BY YOUR PROVIDER   levocetirizine (XYZAL) 5 MG tablet TAKE 1 TABLET BY MOUTH DAILY FOR ALLERGIES   Multiple Vitamin (THERA) TABS Take 1 tablet by mouth.   spironolactone (ALDACTONE) 100 MG tablet Take 1.5 tablets (150 mg)  Daily  for Skin           / TAKE 1 AND 1/2 TABLETS(150 MG) BY MOUTH DAILY   Boric Acid GRAN Once as needed (Patient not taking:  No sig reported)   No current facility-administered medications on file prior to visit.    ROS: all negative except above.   Physical Exam:  BP 108/68   Pulse 78   Temp 97.7 F (36.5 C)   Wt 145 lb (65.8 kg)   SpO2 99%   BMI 23.40 kg/m   General Appearance: Well nourished, in no apparent distress. Eyes: conjunctiva no swelling or erythema ENT/Mouth: mask in place; Hearing normal.  Neck: Supple Respiratory: Respiratory effort normal, Cardio: Appear as well perfused.  Abdomen: Soft, + BS.  Non tender. No CVA tenderness Lymphatics: Non tender without  lymphadenopathy.  Musculoskeletal: no obvious deformity; normal gait.  Skin: Warm, dry without rashes, lesions, ecchymosis.  Neuro: Normal muscle tone Psych: Awake and oriented X 3, normal affect, Insight and Judgment appropriate.  GU: declines   Izora Ribas, NP 2:09 PM [facility]

## 2021-02-08 NOTE — Patient Instructions (Signed)
Sulfamethoxazole; Trimethoprim Tablets What is this medication? SULFAMETHOXAZOLE; TRIMETHOPRIM (suhl fuh meth OK suh zohl; trye METH oh prim) treats infections caused by bacteria. It belongs to a group of medications called sulfonamide antibiotics. It will not treat colds, the flu, or infections caused by viruses. This medicine may be used for other purposes; ask your health care provider or pharmacist if you have questions. COMMON BRAND NAME(S): Bacter-Aid DS, Bactrim, Bactrim DS, Septra, Septra DS What should I tell my care team before I take this medication? They need to know if you have any of these conditions: G6PD deficiency HIV or AIDS Kidney disease Liver disease Low platelets Low red blood cell counts Poor nutrition Stomach or intestine problems like colitis Thyroid disease An unusual or allergic reaction to sulfamethoxazole, trimethoprim, sulfa medications, other medications, foods, dyes, or preservatives Pregnant or trying to get pregnant Breast-feeding How should I use this medication? Take this medication by mouth with a glass of water. Follow the directions on the prescription label. Take your medication at regular intervals. Do not take it more often than directed. Take all of your medication as directed even if you think you are better. Do not skip doses or stop your medication early. Talk to your care team about the use of this medication in children. While this medication may be prescribed for children as young as 2 months for selected conditions, precautions do apply. Overdosage: If you think you have taken too much of this medicine contact a poison control center or emergency room at once. NOTE: This medicine is only for you. Do not share this medicine with others. What if I miss a dose? If you miss a dose, take it as soon as you can. If it is almost time for your next dose, take only that dose. Do not take double or extra doses. What may interact with this  medication? Do not take this medication with any of the following: Dofetilide This medication may also interact with the following: Amantadine Birth control pills Certain medications for blood pressure, heart disease Certain medications for depression, like amitriptyline Certain medications for diabetes, like glipizide or glyburide Certain medications that treat or prevent blood clots like warfarin Cyclosporine Digoxin Diuretics Indomethacin Methotrexate Phenytoin Procainamide Pyrimethamine Zidovudine This list may not describe all possible interactions. Give your health care provider a list of all the medicines, herbs, non-prescription drugs, or dietary supplements you use. Also tell them if you smoke, drink alcohol, or use illegal drugs. Some items may interact with your medicine. What should I watch for while using this medication? Tell your care team if your symptoms do not start to get better or if they get worse. Do not treat diarrhea with over the counter products. Contact your care team if you have diarrhea that lasts more than 2 days or if it is severe and watery. This medication may cause serious skin reactions. They can happen weeks to months after starting the medication. Contact your care team right away if you notice fevers or flu-like symptoms with a rash. The rash may be red or purple and then turn into blisters or peeling of the skin. Or, you might notice a red rash with swelling of the face, lips or lymph nodes in your neck or under your arms. This medication can make you more sensitive to the sun. Keep out of the sun. If you cannot avoid being in the sun, wear protective clothing and sunscreen. Do not use sun lamps or tanning beds/booths. Be careful brushing or  flossing your teeth or using a toothpick because you may get an infection or bleed more easily. If you have any dental work done, tell your dentist you are receiving this medication. What side effects may I notice  from receiving this medication? Side effects that you should report to your care team as soon as possible: Allergic reactions-skin rash, itching, hives, swelling of the face, lips, tongue, or throat Aplastic anemia-unusual weakness or fatigue, dizziness, headache, trouble breathing, increased bleeding or bruising, fever, chills, cough, or sore throat Dry cough, shortness of breath or trouble breathing High potassium level-muscle weakness, fast or irregular heartbeat Liver injury- right upper belly pain, loss of appetite, nausea, light-colored stool, dark yellow or brown urine, yellowing skin or eyes, unusual weakness or fatigue Low blood sugar (hypoglycemia)-tremors or shaking, anxiety, sweating, cold or clammy skin, confusion, dizziness, rapid heartbeat Low sodium level-muscle weakness, fatigue, dizziness, headache, confusion Rash, fever, and swollen lymph nodes Redness, blistering, peeling, or loosening of the skin, including inside the mouth Severe diarrhea, fever Small, pus-filled bumps on skin Unusual vaginal discharge, itching, or odor Side effects that usually do not require medical attention (report to your care team if they continue or are bothersome): Loss of appetite Nausea Vomiting This list may not describe all possible side effects. Call your doctor for medical advice about side effects. You may report side effects to FDA at 1-800-FDA-1088. Where should I keep my medication? Keep out of the reach of children. Store between 15 and 25 degrees C (59 to 77 degrees F). Protect from light. Keep the container tightly closed. Throw away any unused medication after the expiration date. NOTE: This sheet is a summary. It may not cover all possible information. If you have questions about this medicine, talk to your doctor, pharmacist, or health care provider.  2022 Elsevier/Gold Standard (2020-06-01 15:06:53)

## 2021-02-11 LAB — URINALYSIS W MICROSCOPIC + REFLEX CULTURE
Bilirubin Urine: NEGATIVE
Glucose, UA: NEGATIVE
Hgb urine dipstick: NEGATIVE
Hyaline Cast: NONE SEEN /LPF
Ketones, ur: NEGATIVE
Nitrites, Initial: NEGATIVE
Protein, ur: NEGATIVE
Specific Gravity, Urine: 1.017 (ref 1.001–1.035)
pH: 6.5 (ref 5.0–8.0)

## 2021-02-11 LAB — URINE CULTURE
MICRO NUMBER:: 12528089
SPECIMEN QUALITY:: ADEQUATE

## 2021-02-11 LAB — CULTURE INDICATED

## 2021-03-07 NOTE — Progress Notes (Signed)
Assessment and Plan:  Tamiah was seen today for follow-up.  Diagnoses and all orders for this visit:  Reactive depression Lack of benefit and SE with lexapro - reduce to 1/2 tab for 2 weeks then stop Fatigue and poor focus are main concerns, feel she would benefit from wellbutrin Denies SI/HI Start new medication as prescribed Encouraged grief counseling Follow up 6 weeks call the office if any new AE's from medications and we will switch them -     buPROPion (WELLBUTRIN XL) 150 MG 24 hr tablet; Take 1 tab daily in the morning for mood.  Influenza vaccine needed -     Flu Vaccine QUAD 6+ mos PF IM (Fluarix Quad PF)  Further disposition pending results of labs. Discussed med's effects and SE's.   Over 30 minutes of exam, counseling, chart review, and critical decision making was performed.   Future Appointments  Date Time Provider [place]  12/26/2021  9:00 AM Liane Comber, NP [facility] None    ------------------------------------------------------------------------------------------------------------------   HPI BP 100/68   Pulse 70   Temp 97.9 F (36.6 C)   Wt 140 lb (63.5 kg)   SpO2 99%   BMI 22.60 kg/m  55 y.o.female presents for mood/depression follow up.   Mom has advanced cancer, recently opted to withdraw interventions and transitioned to hospice. Patient has been reporting depressed mood related to mom's diagnosis, was started on lexapro 10 mg at previous visit. She reports has had increased fatigue, bloating/gassiness with this medication. Doesn't resolve with taking in the evening. Hasn't noted significant improvement in mood since starting. Having poor energy, difficulty focusing.   She has spoken with therapist Bradd Burner in the past but didn't feel this was helpful. She does have access to grief counseling through hospice, will consider. Denies SI/HI.  [facility] Office Visit from 03/08/2021 in [facility]   PHQ-9 Total Score 14        Past Medical History:  Diagnosis Date   Allergy    Diverticulosis 12/30/2019   Environmental allergies    Fibroid    Hyperlipidemia    Osteopenia 07/2017   Score -1.5 FRAX 4.6% / 0.2%   Uterine fibroid      Allergies  Allergen Reactions   Molds & Smuts Rash    Pt was tested at allergist.    Gluten Meal     Has been tested and received a "false negative"   Jordan [Fish Allergy] Itching    Hives; hypotension Also anchovies    Current Outpatient Medications on File Prior to Visit  Medication Sig   Cholecalciferol (VITAMIN D PO) Take 5,000 Units by mouth.   Cyanocobalamin (VITAMIN B-12 PO) Take by mouth.    EPINEPHrine 0.3 mg/0.3 mL IJ SOAJ injection Inject 0.3 mLs (0.3 mg total) into the muscle as needed for anaphylaxis.   estradiol-norethindrone (COMBIPATCH) 0.05-0.14 MG/DAY Place 1 patch onto the skin 2 (two) times a week.   fenofibrate (TRICOR) 145 MG tablet Take one tablet daily OR as DIRECTED BY YOUR PROVIDER   fluconazole (DIFLUCAN) 150 MG tablet Take 1 tab weekly for 3 weeks.   levocetirizine (XYZAL) 5 MG tablet TAKE 1 TABLET BY MOUTH DAILY FOR ALLERGIES   Multiple Vitamin (THERA) TABS Take 1 tablet by mouth.   spironolactone (ALDACTONE) 100 MG tablet Take 1.5 tablets (150 mg)  Daily  for Skin           / TAKE 1 AND 1/2 TABLETS(150 MG) BY MOUTH DAILY   Boric  Acid GRAN Once as needed (Patient not taking: No sig reported)   No current facility-administered medications on file prior to visit.    ROS: all negative except above.   Physical Exam:  BP 100/68   Pulse 70   Temp 97.9 F (36.6 C)   Wt 140 lb (63.5 kg)   SpO2 99%   BMI 22.60 kg/m   General Appearance: Well nourished, in no apparent distress. Eyes: conjunctiva no swelling or erythema ENT/Mouth: mask in place; Hearing normal.  Neck: Supple Respiratory: Respiratory effort normal Cardio: Appears well perfused Musculoskeletal: no obvious deformity; normal gait.  Skin:  Warm, dry without rashes, lesions, ecchymosis.  Neuro: Normal muscle tone Psych: Awake and oriented X 3, depressed affect, Insight and Judgment appropriate.   Izora Ribas, NP 11:48 AM [facility]

## 2021-03-08 ENCOUNTER — Ambulatory Visit (INDEPENDENT_AMBULATORY_CARE_PROVIDER_SITE_OTHER): Payer: 59 | Admitting: Adult Health

## 2021-03-08 ENCOUNTER — Other Ambulatory Visit: Payer: Self-pay

## 2021-03-08 ENCOUNTER — Encounter: Payer: Self-pay | Admitting: Adult Health

## 2021-03-08 VITALS — BP 100/68 | HR 70 | Temp 97.9°F | Wt 140.0 lb

## 2021-03-08 DIAGNOSIS — Z23 Encounter for immunization: Secondary | ICD-10-CM | POA: Diagnosis not present

## 2021-03-08 DIAGNOSIS — F329 Major depressive disorder, single episode, unspecified: Secondary | ICD-10-CM | POA: Diagnosis not present

## 2021-03-08 DIAGNOSIS — I10 Essential (primary) hypertension: Secondary | ICD-10-CM

## 2021-03-08 MED ORDER — BUPROPION HCL ER (XL) 150 MG PO TB24: ORAL_TABLET | ORAL | 0 refills | Status: DC

## 2021-03-08 NOTE — Patient Instructions (Signed)
Bupropion Tablets (Depression/Mood Disorders) What is this medication? BUPROPION (byoo PROE pee on) treats depression. It increases norepinephrine and dopamine in the brain, hormones that help regulate mood. It belongs to a group of medications called NDRIs. This medicine may be used for other purposes; ask your health care provider or pharmacist if you have questions. COMMON BRAND NAME(S): Wellbutrin What should I tell my care team before I take this medication? They need to know if you have any of these conditions: An eating disorder, such as anorexia or bulimia Bipolar disorder or psychosis Diabetes or high blood sugar, treated with medication Glaucoma Heart disease, previous heart attack, or irregular heart beat Head injury or brain tumor High blood pressure Kidney or liver disease Seizures Suicidal thoughts or a previous suicide attempt Tourette's syndrome Weight loss An unusual or allergic reaction to bupropion, other medications, foods, dyes, or preservatives Pregnant or trying to become pregnant Breast-feeding How should I use this medication? Take this medication by mouth with a glass of water. Follow the directions on the prescription label. You can take it with or without food. If it upsets your stomach, take it with food. Take your medication at regular intervals. Do not take your medication more often than directed. Do not stop taking this medication suddenly except upon the advice of your care team. Stopping this medication too quickly may cause serious side effects or your condition may worsen. A special MedGuide will be given to you by the pharmacist with each prescription and refill. Be sure to read this information carefully each time. Talk to your care team regarding the use of this medication in children. Special care may be needed. Overdosage: If you think you have taken too much of this medicine contact a poison control center or emergency room at once. NOTE: This  medicine is only for you. Do not share this medicine with others. What if I miss a dose? If you miss a dose, take it as soon as you can. If it is less than four hours to your next dose, take only that dose and skip the missed dose. Do not take double or extra doses. What may interact with this medication? Do not take this medication with any of the following: Linezolid MAOIs like Azilect, Carbex, Eldepryl, Marplan, Nardil, and Parnate Methylene blue (injected into a vein) Other medications that contain bupropion like Zyban This medication may also interact with the following: Alcohol Certain medications for anxiety or sleep Certain medications for blood pressure like metoprolol, propranolol Certain medications for depression or psychotic disturbances Certain medications for HIV or AIDS like efavirenz, lopinavir, nelfinavir, ritonavir Certain medications for irregular heart beat like propafenone, flecainide Certain medications for Parkinson's disease like amantadine, levodopa Certain medications for seizures like carbamazepine, phenytoin, phenobarbital Cimetidine Clopidogrel Cyclophosphamide Digoxin Furazolidone Isoniazid Nicotine Orphenadrine Procarbazine Steroid medications like prednisone or cortisone Stimulant medications for attention disorders, weight loss, or to stay awake Tamoxifen Theophylline Thiotepa Ticlopidine Tramadol Warfarin This list may not describe all possible interactions. Give your health care provider a list of all the medicines, herbs, non-prescription drugs, or dietary supplements you use. Also tell them if you smoke, drink alcohol, or use illegal drugs. Some items may interact with your medicine. What should I watch for while using this medication? Tell your care team if your symptoms do not get better or if they get worse. Visit your care team for regular checks on your progress. Because it may take several weeks to see the full effects of this  medication,  it is important to continue your treatment as prescribed. Watch for new or worsening thoughts of suicide or depression. This includes sudden changes in mood, behavior, or thoughts. These changes can happen at any time but are more common in the beginning of treatment or after a change in dose. Call your care team right away if you experience these thoughts or worsening depression. Manic episodes may happen in patients with bipolar disorder who take this medication. Watch for changes in feelings or behaviors such as feeling anxious, nervous, agitated, panicky, irritable, hostile, aggressive, impulsive, severely restless, overly excited and hyperactive, or trouble sleeping. These symptoms can happen at anytime but are more common in the beginning of treatment or after a change in dose. Call your care team right away if you notice any of these symptoms. This medication may cause serious skin reactions. They can happen weeks to months after starting the medication. Contact your care team right away if you notice fevers or flu-like symptoms with a rash. The rash may be red or purple and then turn into blisters or peeling of the skin. Or, you might notice a red rash with swelling of the face, lips or lymph nodes in your neck or under your arms. Avoid drinks that contain alcohol while taking this medication. Drinking large amounts of alcohol, using sleeping or anxiety medications, or quickly stopping the use of these agents while taking this medication may increase your risk for a seizure. Do not drive or use heavy machinery until you know how this medication affects you. This medication can impair your ability to perform these tasks. Do not take this medication close to bedtime. It may prevent you from sleeping. Your mouth may get dry. Chewing sugarless gum or sucking hard candy, and drinking plenty of water may help. Contact your care team if the problem does not go away or is severe. What side  effects may I notice from receiving this medication? Side effects that you should report to your care team as soon as possible: Allergic reactions--skin rash, itching, hives, swelling of the face, lips, tongue, or throat Increase in blood pressure Mood and behavior changes--anxiety, nervousness, confusion, hallucinations, irritability, hostility, thoughts of suicide or self-harm, worsening mood, feelings of depression Redness, blistering, peeling, or loosening of the skin, including inside the mouth Seizures Sudden eye pain or change in vision such as blurry vision, seeing halos around lights, vision loss Side effects that usually do not require medical attention (report to your care team if they continue or are bothersome): Constipation Dizziness Dry mouth Loss of appetite Nausea Tremors or shaking Trouble sleeping This list may not describe all possible side effects. Call your doctor for medical advice about side effects. You may report side effects to FDA at 1-800-FDA-1088. Where should I keep my medication? Keep out of the reach of children and pets. Store at room temperature between 20 and 25 degrees C (68 and 77 degrees F), away from direct sunlight and moisture. Keep tightly closed. Throw away any unused medication after the expiration date. NOTE: This sheet is a summary. It may not cover all possible information. If you have questions about this medicine, talk to your doctor, pharmacist, or health care provider.  2022 Elsevier/Gold Standard (2020-06-22 00:00:00)

## 2021-03-10 ENCOUNTER — Other Ambulatory Visit: Payer: Self-pay | Admitting: Adult Health

## 2021-04-18 NOTE — Progress Notes (Deleted)
Assessment and Plan:  Erica [name] was seen today for follow-up.  Diagnoses and all orders for this visit:  Reactive depression Lack of benefit and SE with lexapro  Wellbutrin *** Denies SI/HI Encouraged grief counseling Follow up 6 weeks call the office if any new AE's from medications and we will switch them -     buPROPion (WELLBUTRIN XL) 150 MG 24 hr tablet; Take 1 tab daily in the morning for mood.   Further disposition pending results of labs. Discussed med's effects and SE's.   Over 30 minutes of exam, counseling, chart review, and critical decision making was performed.   Future Appointments  Date Time Provider [place]  04/19/2021 11:00 AM Erica Comber, NP [facility] None  12/26/2021  9:00 AM Erica Comber, NP [facility] None    ------------------------------------------------------------------------------------------------------------------   HPI There were no vitals taken for this visit. 55 y.o.female presents for 6 week mood/depression follow up.   Mom has advanced cancer, recently opted to withdraw interventions and transitioned to hospice. Patient has been reporting depressed mood related to mom's diagnosis, was started on lexapro 10 mg at previous visit but lack of perceived benefit, was reporting fatigue, difficulty with focus, GI sx with med and last visit was switched to wellbutrin ***, also encouraged therapy/grief counseling (access through hospice) ***  Denies SI/HI.  Erica [name] Office Visit from 03/08/2021 in [facility]  PHQ-9 Total Score 14        Past Medical History:  Diagnosis Date   Allergy    Diverticulosis 12/30/2019   Environmental allergies    Fibroid    Hyperlipidemia    Osteopenia 07/2017   Score -1.5 FRAX 4.6% / 0.2%   Uterine fibroid      Allergies  Allergen Reactions   Molds & Smuts Rash    Pt was tested at allergist.    Gluten Meal     Has been tested and received a "false  negative"   Erica [name] [Fish Allergy] Itching    Hives; hypotension Also anchovies    Current Outpatient Medications on File Prior to Visit  Medication Sig   Boric Acid GRAN Once as needed (Patient not taking: No sig reported)   buPROPion (WELLBUTRIN XL) 150 MG 24 hr tablet Take 1 tab daily in the morning for mood.   Cholecalciferol (VITAMIN D PO) Take 5,000 Units by mouth.   Cyanocobalamin (VITAMIN B-12 PO) Take by mouth.    EPINEPHrine 0.3 mg/0.3 mL IJ SOAJ injection Inject 0.3 mLs (0.3 mg total) into the muscle as needed for anaphylaxis.   estradiol-norethindrone (COMBIPATCH) 0.05-0.14 MG/DAY Place 1 patch onto the skin 2 (two) times a week.   fenofibrate (TRICOR) 145 MG tablet Take one tablet daily OR as DIRECTED BY YOUR PROVIDER   fluconazole (DIFLUCAN) 150 MG tablet Take 1 tab weekly for 3 weeks.   levocetirizine (XYZAL) 5 MG tablet TAKE 1 TABLET BY MOUTH DAILY FOR ALLERGIES   Multiple Vitamin (THERA) TABS Take 1 tablet by mouth.   spironolactone (ALDACTONE) 100 MG tablet Take 1.5 tablets (150 mg)  Daily  for Skin           / TAKE 1 AND 1/2 TABLETS(150 MG) BY MOUTH DAILY   No current facility-administered medications on file prior to visit.    ROS: all negative except above.   Physical Exam:  There were no vitals taken for this visit.  General Appearance: Well nourished, in no apparent distress. Eyes: conjunctiva no swelling or erythema ENT/Mouth: mask in place;  Hearing normal.  Neck: Supple Respiratory: Respiratory effort normal Cardio: Appears well perfused Musculoskeletal: no obvious deformity; normal gait.  Skin: Warm, dry without rashes, lesions, ecchymosis.  Neuro: Normal muscle tone Psych: Awake and oriented X 3, depressed affect, Insight and Judgment appropriate.   Izora Ribas, NP 1:18 PM [facility]

## 2021-04-19 ENCOUNTER — Ambulatory Visit: Payer: 59 | Admitting: Adult Health

## 2021-04-27 ENCOUNTER — Ambulatory Visit: Payer: Commercial Managed Care - HMO | Admitting: Adult Health

## 2021-06-02 IMAGING — US US PELVIS COMPLETE WITH TRANSVAGINAL
1 series · 13 of 25 positions shown · non-contrast
Comparison: Prior CT from 07/07/2007

CLINICAL DATA: Initial evaluation for pelvic pressure, lower
abdominal pain for 2 months. Postmenopausal status.



[Series 1: us pelvis complete with transvaginal · 0.20mm/px · 13 of 63 slices shown]
[im 1/63]
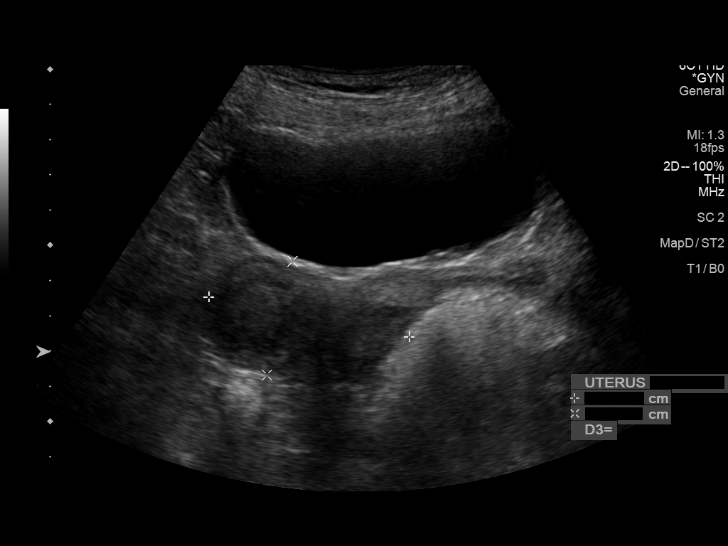
[im 6/63]
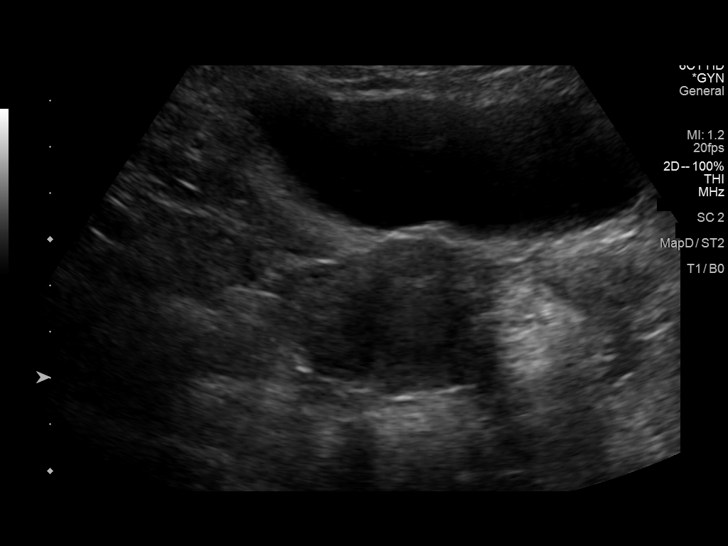
[im 11/63]
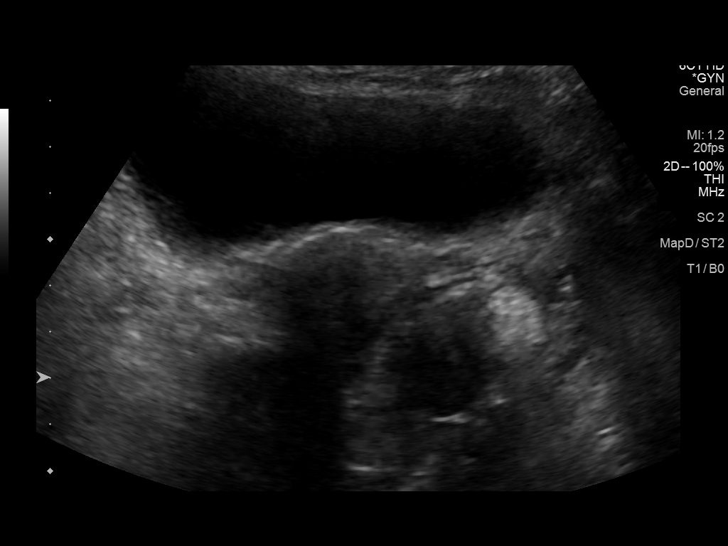
[im 16/63]
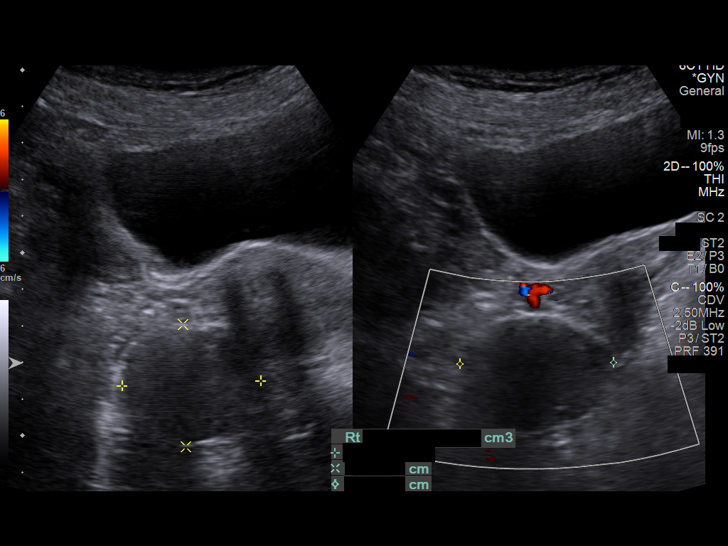
[im 21/63]
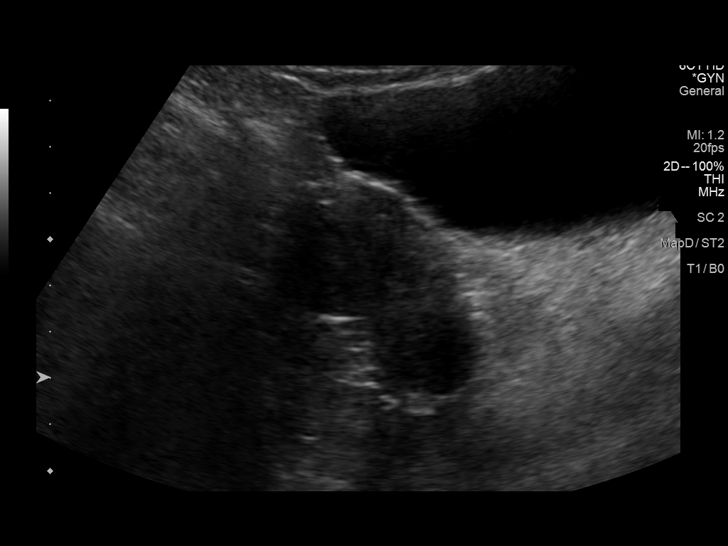
[im 26/63]
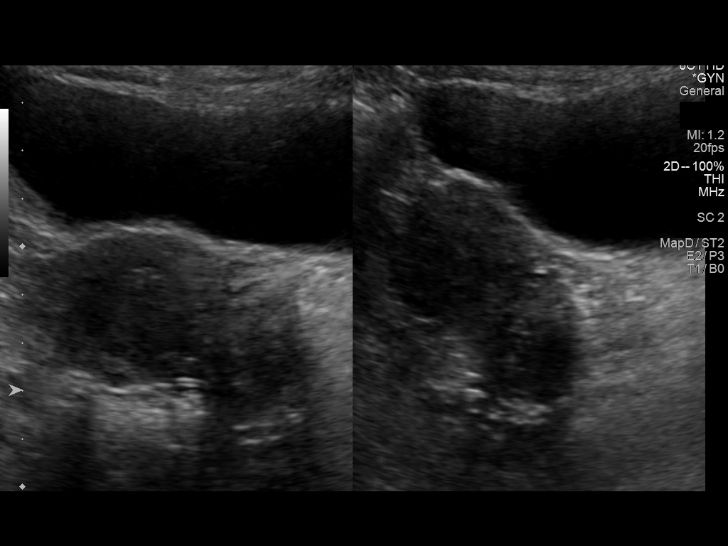
[im 32/63]
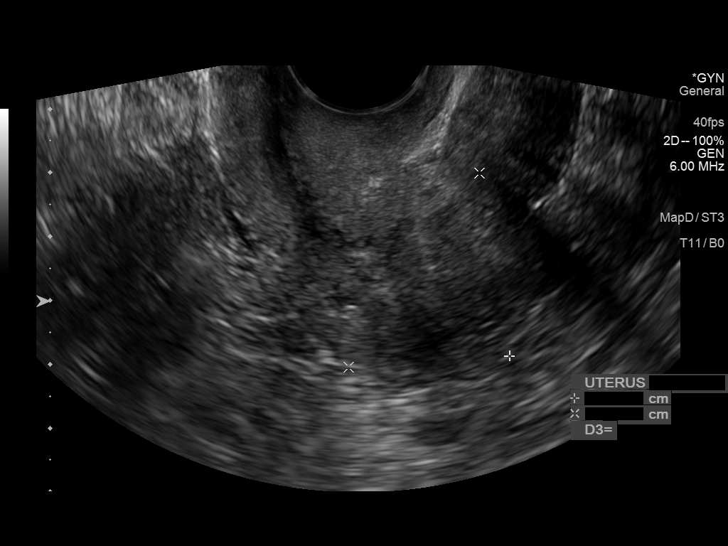
[im 37/63]
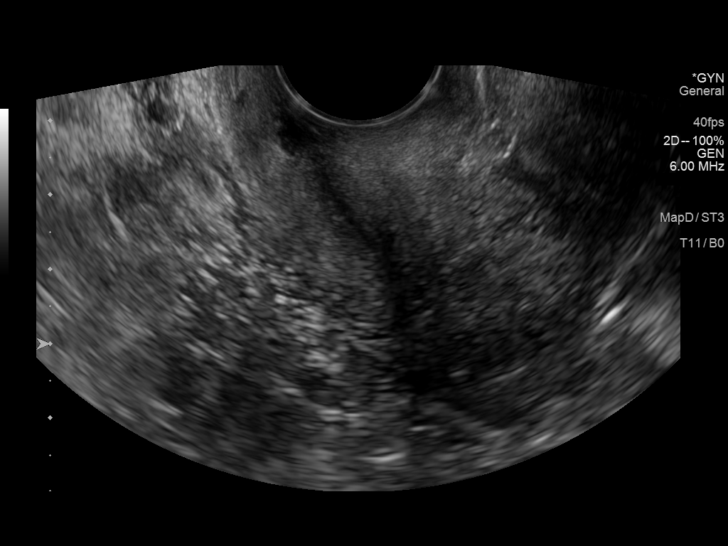
[im 42/63]
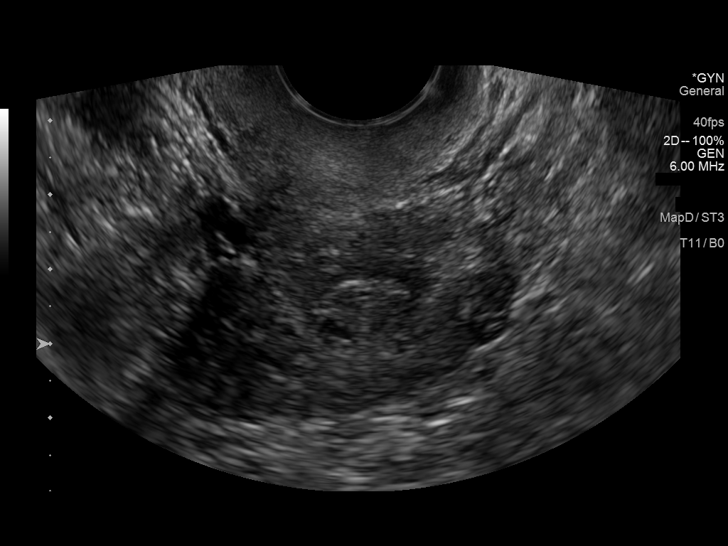
[im 47/63]
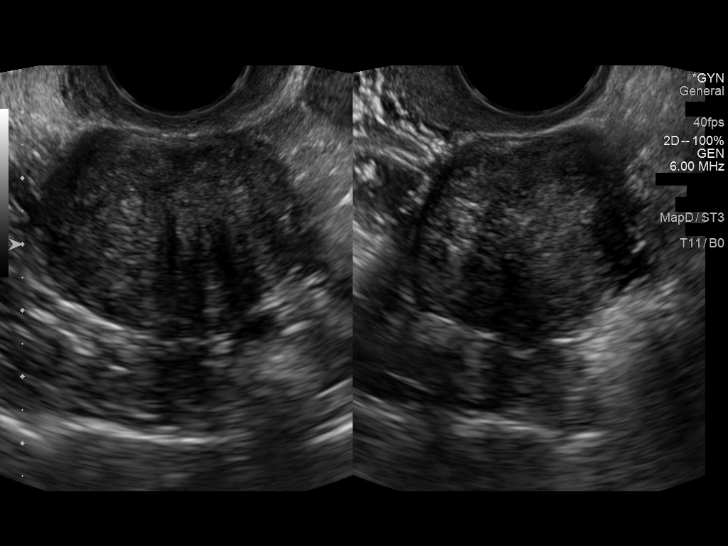
[im 52/63]
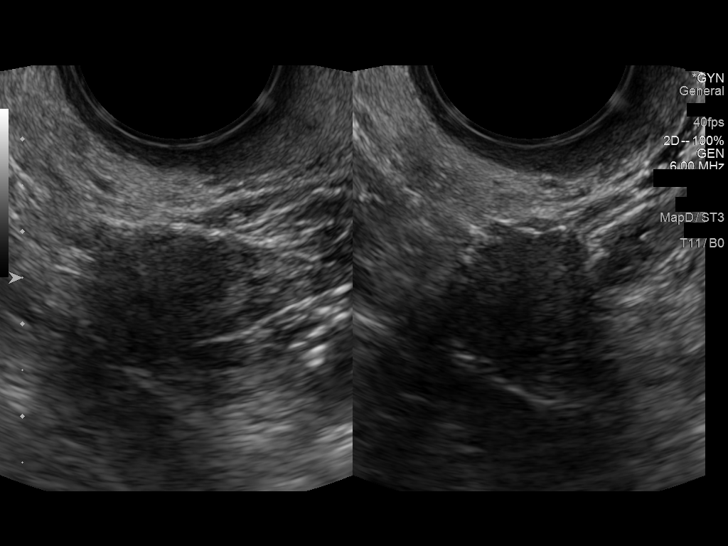
[im 57/63]
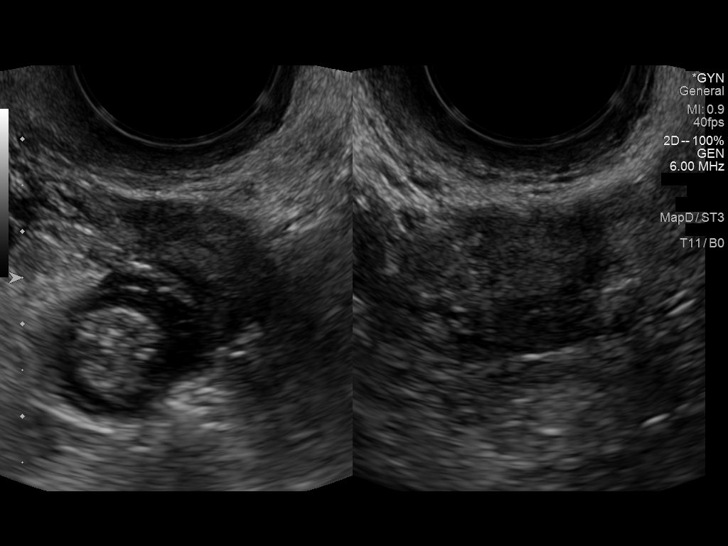
[im 63/63]
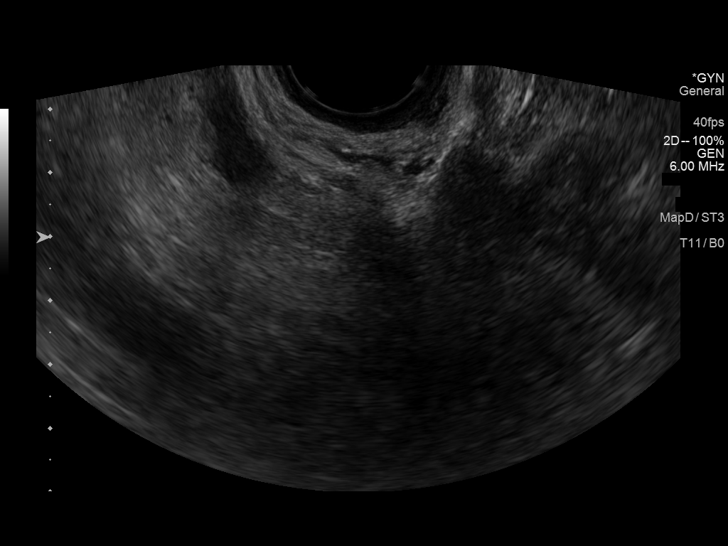

[13 of 25 positions shown; findings below may reference images not displayed]

FINDINGS: Uterus

Measurements: 5.8 x 3.3 x 3.6 cm = volume: 36 mL. 2.0 x 1.9 x 2.2 cm
intramural fibroid present at the central uterine fundus. 3.3 x
x 2.7 cm intramural fibroid present at the left uterine fundus.
Additional 4.0 x 3.1 x 3.5 cm intramural fibroid present at the
right uterine fundus.

Endometrium

Thickness: 2.8 mm.  No focal abnormality visualized.

Right ovary

Measurements: 1.8 x 1.6 x 1.6 cm = volume: 2.4 mL. Normal
appearance/no adnexal mass.

Left ovary

Measurements: 1.9 x 1.1 x 1.5 cm = volume: 1.6 mL. Normal
appearance/no adnexal mass.

Other findings

No abnormal free fluid.
IMPRESSION: 1. Fibroid uterus as detailed above.
2. Normal sonographic appearance of the endometrium and ovaries. No
other acute abnormality. No adnexal mass or free fluid.

## 2021-06-27 NOTE — Progress Notes (Deleted)
Assessment and Plan: ? ?Erica [name] was seen today for follow-up. ? ?Diagnoses and all orders for this visit: ? ?Reactive depression ?Lack of benefit and SE with lexapro - reduce to 1/2 tab for 2 weeks then stop ?Fatigue and poor focus are main concerns, feel she would benefit from wellbutrin ?Denies SI/HI ?Start new medication as prescribed ?Encouraged grief counseling ?Follow up 6 weeks call the office if any new AE's from medications and we will switch them ?-     buPROPion (WELLBUTRIN XL) 150 MG 24 hr tablet; Take 1 tab daily in the morning for mood. ? ? ?Further disposition pending results of labs. Discussed med's effects and SE's.   ?Over 30 minutes of exam, counseling, chart review, and critical decision making was performed.  ? ?Future Appointments  ?Date Time Provider [place]  ?06/28/2021 11:30 AM Liane Comber, NP [facility] None  ?12/26/2021  9:00 AM Liane Comber, NP [facility] None  ? ? ?------------------------------------------------------------------------------------------------------------------ ? ? ?HPI ?There were no vitals taken for this visit. ?56 y.o.female presents for mood/depression follow up.  ? ?Mom had advanced cancer, last fall had opted to withdraw interventions and transitioned to hospice. Patient has been reporting depressed mood related to mom's diagnosis, was started on lexapro 10 mg but experienced increased fatigue, bloating/gassiness with this medication that didn't improve with nocturnal timging of med. She was switched to wellbutrin due to reports of poor energy, difficulty focusing and I had encouraged grief counsling through hospice.  ? ?Mother ultimately passed 05/04/2021 at [facility] placed.  ? ?She has spoken with therapist Bradd Burner in the past but didn't feel this was helpful.  ?She does have access to grief counseling through hospice, will consider. Denies SI/HI. ? ?[place] Office Visit from 03/08/2021 in [place] [facility]   ?PHQ-9 Total Score 14  ? ?  ? ? ? ?Past Medical History:  ?Diagnosis Date  ? Allergy   ? Diverticulosis 12/30/2019  ? Environmental allergies   ? Fibroid   ? Hyperlipidemia   ? Osteopenia 07/2017  ? Score -1.5 FRAX 4.6% / 0.2%  ? Uterine fibroid   ?  ? ?Allergies  ?Allergen Reactions  ? Molds & Smuts Rash  ?  Pt was tested at allergist.   ? Gluten Meal   ?  Has been tested and received a "false negative"  ? [name] [Fish Allergy] Itching  ?  Hives; hypotension ?Also anchovies  ? ? ?Current Outpatient Medications on File Prior to Visit  ?Medication Sig  ? Boric Acid GRAN Once as needed (Patient not taking: No sig reported)  ? buPROPion (WELLBUTRIN XL) 150 MG 24 hr tablet Take 1 tab daily in the morning for mood.  ? Cholecalciferol (VITAMIN D PO) Take 5,000 Units by mouth.  ? Cyanocobalamin (VITAMIN B-12 PO) Take by mouth.   ? EPINEPHrine 0.3 mg/0.3 mL IJ SOAJ injection Inject 0.3 mLs (0.3 mg total) into the muscle as needed for anaphylaxis.  ? estradiol-norethindrone (COMBIPATCH) 0.05-0.14 MG/DAY Place 1 patch onto the skin 2 (two) times a week.  ? fenofibrate (TRICOR) 145 MG tablet Take one tablet daily OR as DIRECTED BY YOUR PROVIDER  ? fluconazole (DIFLUCAN) 150 MG tablet Take 1 tab weekly for 3 weeks.  ? levocetirizine (XYZAL) 5 MG tablet TAKE 1 TABLET BY MOUTH DAILY FOR ALLERGIES  ? Multiple Vitamin (THERA) TABS Take 1 tablet by mouth.  ? spironolactone (ALDACTONE) 100 MG tablet Take 1.5 tablets (150 mg)  Daily  for Skin           /  TAKE 1 AND 1/2 TABLETS(150 MG) BY MOUTH DAILY  ? ?No current facility-administered medications on file prior to visit.  ? ? ?ROS: all negative except above.  ? ?Physical Exam: ? ?There were no vitals taken for this visit. ? ?General Appearance: Well nourished, in no apparent distress. ?Eyes: conjunctiva no swelling or erythema ?ENT/Mouth: mask in place; Hearing normal.  ?Neck: Supple ?Respiratory: Respiratory effort normal ?Cardio: Appears well perfused ?Musculoskeletal: no obvious  deformity; normal gait.  ?Skin: Warm, dry without rashes, lesions, ecchymosis.  ?Neuro: Normal muscle tone ?Psych: Awake and oriented X 3, depressed affect, Insight and Judgment appropriate.  ? ?Izora Ribas, NP ?11:02 AM ?[facility] ? ?

## 2021-06-28 ENCOUNTER — Ambulatory Visit: Payer: Commercial Managed Care - HMO | Admitting: Adult Health

## 2021-06-29 DIAGNOSIS — F4321 Adjustment disorder with depressed mood: Secondary | ICD-10-CM | POA: Insufficient documentation

## 2021-06-29 NOTE — Progress Notes (Signed)
Assessment and Plan: ? ?Erica [name] was seen today for follow-up. ? ?Diagnoses and all orders for this visit: ? ?Reactive depression/ Grief ?Mom passed ?Poorly tolerated lexapro; Benefit with wellbutrin, would like to try 300 mg dose - take 2 tabs of 150 and close follow up in 4 weeks ?Discussed grief counseling; she will reach out to [facility] to initiate, interested in group/seminar option ?Denies SI/HI, alcohol use ?Follow up 4-6 weeks call the office if any new AE's from medications and we will switch them ?-     buPROPion (WELLBUTRIN XL) 150 MG 24 hr tablet; Take 2 tab daily in the morning for mood. ? ?Vaginal odor ?Hx of recurrent BV/yeast, new odor, check wet prep, no other sx, patient self swabbed with CMA instruction ?-     WET PREP BY MOLECULAR PROBE ? ?Further disposition pending results of labs. Discussed med's effects and SE's.   ?Over 30 minutes of exam, counseling, chart review, and critical decision making was performed.  ? ?Future Appointments  ?Date Time Provider [place]  ?12/26/2021  9:00 AM Liane Comber, NP [facility] None  ? ? ?------------------------------------------------------------------------------------------------------------------ ? ? ?HPI ?BP 120/72   Pulse 81   Temp 97.9 ?F (36.6 ?C)   Wt 140 lb (63.5 kg)   SpO2 99%   BMI 22.60 kg/m?  ?56 y.o.female presents for mood/depression follow up.  ? ?Mom had advanced cancer, last fall had opted to withdraw interventions and transitioned to hospice. Patient has been reporting depressed mood related to mom's diagnosis, was started on lexapro 10 mg but didn't tolerate well. She was switched to wellbutrin and today reports has seem improvement in mood with this.  ? ?Mother ultimately passed 05/04/2021 at [facility], very painful end but family was there and she passed while brother prayed for her.   ? ?We again discussed grief counseling today, she is receptive to this, she will reach out to [facility] for resources.  ? ?She is  also concerned about vaginal odor for several days, without itching or discharge, wonders if she should try boric acid vaginal that was recommended in the past.  ? ?Past Medical History:  ?Diagnosis Date  ? Allergy   ? Diverticulosis 12/30/2019  ? Environmental allergies   ? Fibroid   ? Hyperlipidemia   ? Osteopenia 07/2017  ? Score -1.5 FRAX 4.6% / 0.2%  ? Uterine fibroid   ?  ? ?Allergies  ?Allergen Reactions  ? Molds & Smuts Rash  ?  Pt was tested at allergist.   ? Gluten Meal   ?  Has been tested and received a "false negative"  ? Erica [name] [Fish Allergy] Itching  ?  Hives; hypotension ?Also anchovies  ? ? ?Current Outpatient Medications on File Prior to Visit  ?Medication Sig  ? Cholecalciferol (VITAMIN D PO) Take 5,000 Units by mouth.  ? EPINEPHrine 0.3 mg/0.3 mL IJ SOAJ injection Inject 0.3 mLs (0.3 mg total) into the muscle as needed for anaphylaxis.  ? estradiol-norethindrone (COMBIPATCH) 0.05-0.14 MG/DAY Place 1 patch onto the skin 2 (two) times a week.  ? fenofibrate (TRICOR) 145 MG tablet Take one tablet daily OR as DIRECTED BY YOUR PROVIDER  ? levocetirizine (XYZAL) 5 MG tablet TAKE 1 TABLET BY MOUTH DAILY FOR ALLERGIES  ? Multiple Vitamin (THERA) TABS Take 1 tablet by mouth.  ? spironolactone (ALDACTONE) 100 MG tablet Take 1.5 tablets (150 mg)  Daily  for Skin           / TAKE 1 AND 1/2  TABLETS(150 MG) BY MOUTH DAILY  ? Boric Acid GRAN Once as needed (Patient not taking: Reported on 12/22/2020)  ? Cyanocobalamin (VITAMIN B-12 PO) Take by mouth.  (Patient not taking: Reported on 06/30/2021)  ? fluconazole (DIFLUCAN) 150 MG tablet Take 1 tab weekly for 3 weeks. (Patient not taking: Reported on 06/30/2021)  ? ?No current facility-administered medications on file prior to visit.  ? ? ?ROS: all negative except above.  ? ?Physical Exam: ? ?BP 120/72   Pulse 81   Temp 97.9 ?F (36.6 ?C)   Wt 140 lb (63.5 kg)   SpO2 99%   BMI 22.60 kg/m?  ? ?General Appearance: Well nourished, in no apparent distress. ?Eyes:  conjunctiva no swelling or erythema ?ENT/Mouth: mask in place; Hearing normal.  ?Neck: Supple ?Respiratory: Respiratory effort normal ?Cardio: Appears well perfused ?Musculoskeletal: no obvious deformity; normal gait.  ?Skin: Warm, dry without rashes, lesions, ecchymosis.  ?Neuro: Normal muscle tone ?Psych: Awake and oriented X 3, depressed affect, Insight and Judgment appropriate.  ? ?Erica Ribas, NP ?12:11 PM ?[facility] ? ?

## 2021-06-30 ENCOUNTER — Encounter: Payer: Self-pay | Admitting: Adult Health

## 2021-06-30 ENCOUNTER — Ambulatory Visit: Payer: Managed Care, Other (non HMO) | Admitting: Adult Health

## 2021-06-30 ENCOUNTER — Other Ambulatory Visit: Payer: Self-pay

## 2021-06-30 VITALS — BP 120/72 | HR 81 | Temp 97.9°F | Wt 140.0 lb

## 2021-06-30 DIAGNOSIS — F4321 Adjustment disorder with depressed mood: Secondary | ICD-10-CM

## 2021-06-30 DIAGNOSIS — F329 Major depressive disorder, single episode, unspecified: Secondary | ICD-10-CM | POA: Diagnosis not present

## 2021-06-30 DIAGNOSIS — N898 Other specified noninflammatory disorders of vagina: Secondary | ICD-10-CM

## 2021-06-30 MED ORDER — BUPROPION HCL ER (XL) 150 MG PO TB24: ORAL_TABLET | ORAL | 0 refills | Status: DC

## 2021-06-30 NOTE — Patient Instructions (Signed)
Boric Acid vaginal suppository ?What is this medication? ?BORIC ACID (BOHR ik  AS id) helps to promote the proper acid balance in the vagina.  ?This medicine may be used for other purposes; ask your health care provider or pharmacist if you have questions. ?COMMON BRAND NAME(S): AZO Boric Acid with Aloe Vera, Hylafem ?What should I tell my care team before I take this medication? ?They need to know if you have any of these conditions: ?diabetes ?frequent infections ?HIV or AIDS ?immune system problems ?an unusual or allergic reaction to boric acid, other medicines, foods, dyes, or preservatives ?pregnant or trying to get pregnant ?breast-feeding ?How should I use this medication? ?This medicine is for use in the vagina. Do not take by mouth. Follow the directions on the prescription label. Read package directions carefully before using. Wash hands before and after use. Use this medicine at bedtime, unless otherwise directed by your doctor. Do not use your medicine more often than directed. Do not stop using this medicine except on your doctor's advice. ?Talk to your pediatrician regarding the use of this medicine in children. This medicine is not approved for use in children. ?Overdosage: If you think you have taken too much of this medicine contact a poison control center or emergency room at once. ?NOTE: This medicine is only for you. Do not share this medicine with others. ?What if I miss a dose? ?If you miss a dose, use it as soon as you can. If it is almost time for your next dose, use only that dose. Do not use double or extra doses. ?What may interact with this medication? ?Interactions are not expected. Do not use any other vaginal products without telling your doctor or health care professional. ?This list may not describe all possible interactions. Give your health care provider a list of all the medicines, herbs, non-prescription drugs, or dietary supplements you use. Also tell them if you smoke, drink  alcohol, or use illegal drugs. Some items may interact with your medicine. ?What should I watch for while using this medication? ?Tell your doctor or health care professional if your symptoms do not start to get better within a few days. ?It is better not to have sex until you have finished your treatment. This medicine may damage condoms or diaphragms and cause them not to work properly. It may also decrease the effect of vaginal spermicides. Do not rely on any of these methods to prevent sexually transmitted diseases or pregnancy while you are using this medicine. ?Vaginal medicines usually will come out of the vagina during treatment. To keep the medicine from getting on your clothing, wear a panty liner. The use of tampons is not recommended. To help clear up the infection, wear freshly washed cotton, not synthetic, underwear. ?What side effects may I notice from receiving this medication? ?Side effects that you should report to your doctor or health care professional as soon as possible: ?allergic reactions like skin rash, itching or hives ?vaginal irritation, redness, or burning ?Side effects that usually do not require medical attention (report to your doctor or health care professional if they continue or are bothersome): ?vaginal discharge ?This list may not describe all possible side effects. Call your doctor for medical advice about side effects. You may report side effects to FDA at 1-800-FDA-1088. ?Where should I keep my medication? ?Keep out of the reach of children. ?Store in a cool, dry place between 15 and 30 degrees C (59 and 86 degrees F). Keep away from sunlight.  Throw away any unused medicine after the expiration date. ?NOTE: This sheet is a summary. It may not cover all possible information. If you have questions about this medicine, talk to your doctor, pharmacist, or health care provider. ?? 2022 Elsevier/Gold Standard (2015-05-12 00:00:00) ? ?

## 2021-07-01 LAB — WET PREP BY MOLECULAR PROBE
Candida species: DETECTED — AB
Gardnerella vaginalis: NOT DETECTED
MICRO NUMBER:: 13118148
SPECIMEN QUALITY:: ADEQUATE
Trichomonas vaginosis: NOT DETECTED

## 2021-07-02 ENCOUNTER — Other Ambulatory Visit: Payer: Self-pay | Admitting: Adult Health

## 2021-07-02 DIAGNOSIS — B3731 Acute candidiasis of vulva and vagina: Secondary | ICD-10-CM | POA: Insufficient documentation

## 2021-07-02 MED ORDER — FLUCONAZOLE 150 MG PO TABS: ORAL_TABLET | ORAL | 0 refills | Status: DC

## 2021-07-04 ENCOUNTER — Telehealth: Payer: Self-pay | Admitting: Adult Health

## 2021-07-04 ENCOUNTER — Other Ambulatory Visit: Payer: Self-pay | Admitting: Adult Health

## 2021-07-04 DIAGNOSIS — B3731 Acute candidiasis of vulva and vagina: Secondary | ICD-10-CM

## 2021-07-04 MED ORDER — TERCONAZOLE 0.8 % VA CREA: 1.0000 | TOPICAL_CREAM | Freq: Every day | VAGINAL | 2 refills | Status: AC

## 2021-07-04 NOTE — Telephone Encounter (Signed)
Erica [name] called the pt to schedule an appt for a follow up but the patient is wanting to keep her medications the same to not have to do a follow up in 4 weeks  ?

## 2021-07-05 ENCOUNTER — Encounter: Payer: Self-pay | Admitting: Adult Health

## 2021-07-05 ENCOUNTER — Other Ambulatory Visit: Payer: Self-pay

## 2021-07-05 ENCOUNTER — Ambulatory Visit (INDEPENDENT_AMBULATORY_CARE_PROVIDER_SITE_OTHER): Payer: Managed Care, Other (non HMO) | Admitting: Adult Health

## 2021-07-05 VITALS — BP 110/64 | HR 91 | Temp 97.9°F | Wt 139.0 lb

## 2021-07-05 DIAGNOSIS — R350 Frequency of micturition: Secondary | ICD-10-CM | POA: Diagnosis not present

## 2021-07-05 NOTE — Progress Notes (Signed)
Assessment and Plan: ? ?Alliah was seen today for acute visit. ? ?Diagnoses and all orders for this visit: ? ?Urinary frequency ?UTI versus OAB  ?- will check UA, C&S. Advised to stop artificial sweeteners.  ?- if negative will try myrbetriq - given 2 weeks of 50 mg tab samples to hold ?-     Urinalysis, Routine w reflex microscopic ?-     Urine Culture ? ?Further disposition pending results of labs. Discussed med's effects and SE's.   ?Over 15 minutes of exam, counseling, chart review, and critical decision making was performed.  ? ?Future Appointments  ?Date Time Provider [place]  ?12/26/2021  9:00 AM Liane Comber, NP [facility] None  ? ? ?------------------------------------------------------------------------------------------------------------------ ? ? ?HPI ?BP 110/64   Pulse 91   Temp 97.9 ?F (36.6 ?C)   Wt 139 lb (63 kg)   SpO2 99%   BMI 22.44 kg/m?  ?56 y.o.female presents for evaluation of possible UTI.  ? ?She reports started noting increased urinary frequency over last 1-2 weeks worse last 2-3 days, now almost constant. Denies urgency. Denies dysuria, urine odor, flank pain, fever/chills. Denies abdominal pain or nausea.  ? ?Past Medical History:  ?Diagnosis Date  ? Allergy   ? Diverticulosis 12/30/2019  ? Environmental allergies   ? Fibroid   ? Hyperlipidemia   ? Osteopenia 07/2017  ? Score -1.5 FRAX 4.6% / 0.2%  ? Uterine fibroid   ?  ? ?Allergies  ?Allergen Reactions  ? Molds & Smuts Rash  ?  Pt was tested at allergist.   ? Gluten Meal   ?  Has been tested and received a "false negative"  ? [name] [Fish Allergy] Itching  ?  Hives; hypotension ?Also anchovies  ? ? ?Current Outpatient Medications on File Prior to Visit  ?Medication Sig  ? buPROPion (WELLBUTRIN XL) 150 MG 24 hr tablet Take 2 tab daily in the morning for mood.  ? Cholecalciferol (VITAMIN D PO) Take 5,000 Units by mouth.  ? EPINEPHrine 0.3 mg/0.3 mL IJ SOAJ injection Inject 0.3 mLs (0.3 mg total) into the muscle as needed for  anaphylaxis.  ? estradiol-norethindrone (COMBIPATCH) 0.05-0.14 MG/DAY Place 1 patch onto the skin 2 (two) times a week.  ? fenofibrate (TRICOR) 145 MG tablet Take one tablet daily OR as DIRECTED BY YOUR PROVIDER  ? levocetirizine (XYZAL) 5 MG tablet TAKE 1 TABLET BY MOUTH DAILY FOR ALLERGIES  ? Multiple Vitamin (THERA) TABS Take 1 tablet by mouth.  ? spironolactone (ALDACTONE) 100 MG tablet Take 1.5 tablets (150 mg)  Daily  for Skin           / TAKE 1 AND 1/2 TABLETS(150 MG) BY MOUTH DAILY  ? terconazole (TERAZOL 3) 0.8 % vaginal cream Place 1 applicator vaginally at bedtime for 3 days. For yeast.  ? Boric Acid GRAN Once as needed (Patient not taking: Reported on 12/22/2020)  ? Cyanocobalamin (VITAMIN B-12 PO) Take by mouth.  (Patient not taking: Reported on 06/30/2021)  ? fluconazole (DIFLUCAN) 150 MG tablet Take 1 tab once for vaginal yeast. (Patient not taking: Reported on 07/05/2021)  ? ?No current facility-administered medications on file prior to visit.  ? ? ?ROS: all negative except above.  ? ?Physical Exam: ? ?BP 110/64   Pulse 91   Temp 97.9 ?F (36.6 ?C)   Wt 139 lb (63 kg)   SpO2 99%   BMI 22.44 kg/m?  ? ?General Appearance: Well nourished, in no apparent distress. ?Eyes: PERRLA, conjunctiva no swelling or erythema ?  ENT/Mouth: mask in place; Hearing normal.  ?Neck: Supple ?Respiratory: Respiratory effort normal ?Cardio: Appears well perfused ?Abdomen: Soft, + BS.  Non tender. ?Musculoskeletal: no obvious deformity; normal gait.  ?Skin: Warm, dry without rashes, lesions, ecchymosis.  ?Neuro: Normal muscle tone ?Psych: Awake and oriented X 3, normal affect, Insight and Judgment appropriate.  ? ? ?Izora Ribas, NP ?2:06 PM ?[facility] ? ?

## 2021-07-07 ENCOUNTER — Other Ambulatory Visit: Payer: Self-pay | Admitting: Adult Health

## 2021-07-07 DIAGNOSIS — N390 Urinary tract infection, site not specified: Secondary | ICD-10-CM

## 2021-07-07 LAB — URINE CULTURE
MICRO NUMBER:: 13135006
SPECIMEN QUALITY:: ADEQUATE

## 2021-07-07 LAB — URINALYSIS, ROUTINE W REFLEX MICROSCOPIC
Bacteria, UA: NONE SEEN /HPF
Bilirubin Urine: NEGATIVE
Glucose, UA: NEGATIVE
Hgb urine dipstick: NEGATIVE
Hyaline Cast: NONE SEEN /LPF
Ketones, ur: NEGATIVE
Nitrite: NEGATIVE
Protein, ur: NEGATIVE
RBC / HPF: NONE SEEN /HPF (ref 0–2)
Specific Gravity, Urine: 1.019 (ref 1.001–1.035)
pH: 5.5 (ref 5.0–8.0)

## 2021-07-07 LAB — MICROSCOPIC MESSAGE

## 2021-07-07 MED ORDER — SULFAMETHOXAZOLE-TRIMETHOPRIM 800-160 MG PO TABS: 1.0000 | ORAL_TABLET | Freq: Two times a day (BID) | ORAL | 0 refills | Status: DC

## 2021-07-25 ENCOUNTER — Ambulatory Visit (INDEPENDENT_AMBULATORY_CARE_PROVIDER_SITE_OTHER): Payer: Managed Care, Other (non HMO) | Admitting: Adult Health

## 2021-07-25 ENCOUNTER — Encounter: Payer: Self-pay | Admitting: Adult Health

## 2021-07-25 VITALS — BP 100/74 | HR 78 | Temp 97.7°F | Wt 138.4 lb

## 2021-07-25 DIAGNOSIS — R350 Frequency of micturition: Secondary | ICD-10-CM | POA: Diagnosis not present

## 2021-07-25 DIAGNOSIS — F324 Major depressive disorder, single episode, in partial remission: Secondary | ICD-10-CM

## 2021-07-25 DIAGNOSIS — B3731 Acute candidiasis of vulva and vagina: Secondary | ICD-10-CM | POA: Diagnosis not present

## 2021-07-25 MED ORDER — FLUCONAZOLE 150 MG PO TABS: ORAL_TABLET | ORAL | 3 refills | Status: DC

## 2021-07-25 MED ORDER — VORTIOXETINE HBR 10 MG PO TABS: ORAL_TABLET | ORAL | 0 refills | Status: DC

## 2021-07-25 NOTE — Progress Notes (Signed)
Assessment and Plan: ? ?Erica [name] was seen today for follow-up. ? ?Diagnoses and all orders for this visit: ? ?Urinary frequency ?Will confirm resolution of UTI after last visit ?Otherwise ? SE due to wellbutrin - stop med x 2 weeks ?Hydrate well, limit caffeine, artificial sweeteners ?-     Urinalysis, Routine w reflex microscopic ?-     Urine Culture ? ?Vaginal candidiasis ?-     fluconazole (DIFLUCAN) 150 MG tablet; Take 1 tab once for vaginal yeast. ? ?Major depressive disorder with single episode, in partial remission ([place]) ?Stop wellbutrin for possible SE ?Overall feeling more positive, will try monitoring with just lifestyle ?Consider counseling; have discussed grief counseling resources ?Reports supportive friends ?Lifestyle discussed: diet/exerise, sleep hygiene, stress management, hydration ?Has failed lexapro; discussed and have her hold trintellix 10 mg samples x 2 weeks to start if recurrent depressive sx ?Follow up as needed if restarts med ?-     vortioxetine HBr (TRINTELLIX) 10 MG TABS tablet; Take 1 tab daily for depression/mood. ? ?Further disposition pending results of labs. Discussed med's effects and SE's.   ?Over 30 minutes of exam, counseling, chart review, and critical decision making was performed.  ? ?Future Appointments  ?Date Time Provider [place]  ?12/26/2021  9:00 AM Liane Comber, NP [facility] None  ? ? ?------------------------------------------------------------------------------------------------------------------ ? ? ?HPI ?BP 100/74   Pulse 78   Temp 97.7 ?F (36.5 ?C)   Wt 138 lb 6.4 oz (62.8 kg)   SpO2 99%   BMI 22.34 kg/m?  ?56 y.o.female with depression presents for evaluation of persistent urinary frequency.  ? ?She was seen on 07/05/2020 with UTI like sx, culture 07/05/2021 did show UTI, vaginal swab also showed yeast infection, completed course of bactrim and reports sig improved (pressure, burning, etc) but with persistent urinary frequency "every 15 min."  [place] she feels this started with new prescription of wellbutrin and has been persistent for some time.  ? ?Depression with mom terminal dx, has now passed, she reports in much better place, likely moving, starting new job in 2 weeks and feeling very positive.  ? ?Denies SI/HI; family is blended, not supportive/feels toxic ?Does report good supportive friends who she can rely on and speak to ? ?Working on lifestyle -  ? ?Denies drug use, rare alcohol ? ?Past Medical History:  ?Diagnosis Date  ? Allergy   ? Diverticulosis 12/30/2019  ? Environmental allergies   ? Fibroid   ? Hyperlipidemia   ? Osteopenia 07/2017  ? Score -1.5 FRAX 4.6% / 0.2%  ? Uterine fibroid   ?  ? ?Allergies  ?Allergen Reactions  ? Molds & Smuts Rash  ?  Pt was tested at allergist.   ? Gluten Meal   ?  Has been tested and received a "false negative"  ? Erica [name] [Fish Allergy] Itching  ?  Hives; hypotension ?Also anchovies  ? ? ?Current Outpatient Medications on File Prior to Visit  ?Medication Sig  ? buPROPion (WELLBUTRIN XL) 150 MG 24 hr tablet Take 2 tab daily in the morning for mood.  ? Cholecalciferol (VITAMIN D PO) Take 5,000 Units by mouth.  ? Cyanocobalamin (VITAMIN B-12 PO) Take by mouth.  ? EPINEPHrine 0.3 mg/0.3 mL IJ SOAJ injection Inject 0.3 mLs (0.3 mg total) into the muscle as needed for anaphylaxis.  ? estradiol-norethindrone (COMBIPATCH) 0.05-0.14 MG/DAY Place 1 patch onto the skin 2 (two) times a week.  ? fenofibrate (TRICOR) 145 MG tablet Take one tablet daily OR as DIRECTED BY YOUR PROVIDER  ?  levocetirizine (XYZAL) 5 MG tablet TAKE 1 TABLET BY MOUTH DAILY FOR ALLERGIES  ? Multiple Vitamin (THERA) TABS Take 1 tablet by mouth.  ? spironolactone (ALDACTONE) 100 MG tablet Take 1.5 tablets (150 mg)  Daily  for Skin           / TAKE 1 AND 1/2 TABLETS(150 MG) BY MOUTH DAILY  ? Boric Acid GRAN Once as needed (Patient not taking: Reported on 12/22/2020)  ? fluconazole (DIFLUCAN) 150 MG tablet Take 1 tab once for vaginal yeast. (Patient not  taking: Reported on 07/05/2021)  ? sulfamethoxazole-trimethoprim (BACTRIM DS) 800-160 MG tablet Take 1 tablet by mouth 2 (two) times daily.  ? ?No current facility-administered medications on file prior to visit.  ? ? ?Allergies:  ?Allergies  ?Allergen Reactions  ? Molds & Smuts Rash  ?  Pt was tested at allergist.   ? Gluten Meal   ?  Has been tested and received a "false negative"  ? Erica [name] [Fish Allergy] Itching  ?  Hives; hypotension ?Also anchovies  ? ?Family History:  ?Herfamily history includes Breast cancer in her mother; Cancer in her maternal uncle and mother; Heart disease in her maternal grandmother; Hyperlipidemia in her maternal grandmother; Hypertension in her maternal grandmother; Thyroid disease in her maternal grandmother. ?Social History:  ? reports that she has never smoked. She has never used smokeless tobacco. She reports current alcohol use. She reports that she does not use drugs. ? ?ROS: all negative except above.  ? ?Physical Exam: ? ?BP 100/74   Pulse 78   Temp 97.7 ?F (36.5 ?C)   Wt 138 lb 6.4 oz (62.8 kg)   SpO2 99%   BMI 22.34 kg/m?  ? ?General Appearance: Well nourished, in no apparent distress. ?Eyes: PERRLA, conjunctiva no swelling or erythema ?ENT/Mouth: mask in place; Hearing normal.  ?Neck: Supple ?Respiratory: Respiratory effort normal, BS equal bilaterally without rales, rhonchi, wheezing or stridor.  ?Cardio: RRR with no MRGs. Brisk peripheral pulses without edema.  ?Abdomen: Soft, + BS.  Non tender, no guarding, rebound, hernias, masses. ?Lymphatics: Non tender without lymphadenopathy.  ?Musculoskeletal: no obvious deformity; normal gait.  ?Skin: Warm, dry without rashes, lesions, ecchymosis.  ?Neuro: Normal muscle tone ?Psych: Awake and oriented X 3, normal affect, Insight and Judgment appropriate.  ? ?Izora Ribas, NP ?11:18 AM ?[facility] ? ?

## 2021-07-25 NOTE — Patient Instructions (Signed)
Ok to stop wellbutrin 150 mg -  ? ?Monitor symptoms for 1-2 weeks (urinary frequency, depressive mood, poor motivation, energy, not enjoying things you would normally enjoy, etc) ? ?If noting more depressive sx can try trintellix 10 mg 1 tab daily for 2 weeks and let me know-  ? ? ? ?Managing Depression, Adult ?Depression is a mental health condition that affects your thoughts, feelings, and actions. Being diagnosed with depression can bring you relief if you did not know why you have felt or behaved a certain way. It could also leave you feeling overwhelmed with uncertainty about your future. Preparing yourself to manage your symptoms can help you feel more positive about your future. ?How to manage lifestyle changes ?Managing stress ?Stress is your body's reaction to life changes and events, both good and bad. Stress can add to your feelings of depression. Learning to manage your stress can help lessen your feelings of depression. ?Try some of the following approaches to reducing your stress (stress reduction techniques): ?Listen to music that you enjoy and that inspires you. ?Try using a meditation app or take a meditation class. ?Develop a practice that helps you connect with your spiritual self. Walk in nature, pray, or go to a place of worship. ?Do some deep breathing. To do this, inhale slowly through your nose. Pause at the top of your inhale for a few seconds and then exhale slowly, letting your muscles relax. ?Practice yoga to help relax and work your muscles. ?Choose a stress reduction technique that suits your lifestyle and personality. These techniques take time and practice to develop. Set aside 5-15 minutes a day to do them. Therapists can offer training in these techniques. Other things you can do to manage stress include: ?Keeping a stress diary. ?Knowing your limits and saying no when you think something is too much. ?Paying attention to how you react to certain situations. You may not be able to  control everything, but you can change your reaction. ?Adding humor to your life by watching funny films or TV shows. ?Making time for activities that you enjoy and that relax you. ? ?Medicines ?Medicines, such as antidepressants, are often a part of treatment for depression. ?Talk with your pharmacist or health care provider about all the medicines, supplements, and herbal products that you take, their possible side effects, and what medicines and other products are safe to take together. ?Make sure to report any side effects you may have to your health care provider. ?Relationships ?Your health care provider may suggest family therapy, couples therapy, or individual therapy as part of your treatment. ?How to recognize changes ?Everyone responds differently to treatment for depression. As you recover from depression, you may start to: ?Have more interest in doing activities. ?Feel less hopeless. ?Have more energy. ?Overeat less often, or have a better appetite. ?Have better mental focus. ?It is important to recognize if your depression is not getting better or is getting worse. The symptoms you had in the beginning may return, such as: ?Tiredness (fatigue) or low energy. ?Eating too much or too little. ?Sleeping too much or too little. ?Feeling restless, agitated, or hopeless. ?Trouble focusing or making decisions. ?Unexplained physical complaints. ?Feeling irritable, angry, or aggressive. ?If you or your family members notice these symptoms coming back, let your health care provider know right away. ?Follow these instructions at home: ?Activity ? ?Try to get some form of exercise each day, such as walking, biking, swimming, or lifting weights. ?Practice stress reduction techniques. ?Engage your  mind by taking a class or doing some volunteer work. ?Lifestyle ?Get the right amount and quality of sleep. ?Cut down on using caffeine, tobacco, alcohol, and other potentially harmful substances. ?Eat a healthy diet that  includes plenty of vegetables, fruits, whole grains, low-fat dairy products, and lean protein. Do not eat a lot of foods that are high in solid fats, added sugars, or salt (sodium). ?General instructions ?Take over-the-counter and prescription medicines only as told by your health care provider. ?Keep all follow-up visits as told by your health care provider. This is important. ?Where to find support ?Talking to others ?Friends and family members can be sources of support and guidance. Talk to trusted friends or family members about your condition. Explain your symptoms to them, and let them know that you are working with a health care provider to treat your depression. Tell friends and family members how they also can be helpful. ?Finances ?Find appropriate mental health providers that fit with your financial situation. ?Talk with your health care provider about options to get reduced prices on your medicines. ?Where to find more information ?You can find support in your area from: ?Anxiety and Depression Association of America (ADAA): https://www.clark.net/ ?Mental Health America: www.mentalhealthamerica.net ?National Alliance on Mental Illness: www.nami.org ?Contact a health care provider if: ?You stop taking your antidepressant medicines, and you have any of these symptoms: ?Nausea. ?Headache. ?Light-headedness. ?Chills and body aches. ?Not being able to sleep (insomnia). ?You or your friends and family think your depression is getting worse. ?Get help right away if: ?You have thoughts of hurting yourself or others. ?If you ever feel like you may hurt yourself or others, or have thoughts about taking your own life, get help right away. Go to your nearest emergency department or: ?Call your local emergency services (911 in the [place] ?Call a suicide crisis helpline, such as the [place] at (504)204-5211 or 988 in the [place]. This is open 24 hours a day in the [place] ?Text the Crisis Text Line at  813-721-3308 (in the [place].). ?Summary ?If you are diagnosed with depression, preparing yourself to manage your symptoms is a good way to feel positive about your future. ?Work with your health care provider on a management plan that includes stress reduction techniques, medicines (if applicable), therapy, and healthy lifestyle habits. ?Keep talking with your health care provider about how your treatment is working. ?If you have thoughts about taking your own life, call a suicide crisis helpline or text a crisis text line. ?This information is not intended to replace advice given to you by your health care provider. Make sure you discuss any questions you have with your health care provider. ?Document Revised: 11/02/2020 Document Reviewed: 02/18/2019 ?Elsevier Patient Education ? 2022 [place]. ? ? ? ?Vortioxetine oral tablet ?What is this medication? ?Vortioxetine (vor tee [employer] e teen) treats depression. It increases the amount of serotonin in the brain, a substance that helps regulate mood. ?This medicine may be used for other purposes; ask your health care provider or pharmacist if you have questions. ?COMMON BRAND NAME(S): BRINTELLIX, Trintellix ?What should I tell my care team before I take this medication? ?They need to know if you have any of these conditions: ?Bipolar disorder or a family history of bipolar disorder ?Bleeding disorder ?Glaucoma ?Liver disease ?Low levels of sodium in the blood ?Seizures ?Suicidal thoughts, plans, or attempt; a previous suicide attempt by you or a family member ?Take medications that treat or prevent blood clots ?Taken an  MAOI such as [place], [place], or [place] in the last 14 days ?An unusual or allergic reaction to vortioxetine, other medications, foods, dyes, or preservatives ?Pregnant or trying to get pregnant ?Breast-feeding ?How should I use this medication? ?Take this medication by mouth with water. Take it as directed on the prescription label at the same time every day.  You can take it with or without food. If it upsets your stomach, take it with food. Keep taking it unless your care team tells you to stop. ?A special MedGuide will be given to you by the pharmacist with eac

## 2021-07-26 LAB — URINALYSIS, ROUTINE W REFLEX MICROSCOPIC
Bacteria, UA: NONE SEEN /HPF
Bilirubin Urine: NEGATIVE
Glucose, UA: NEGATIVE
Hgb urine dipstick: NEGATIVE
Hyaline Cast: NONE SEEN /LPF
Ketones, ur: NEGATIVE
Nitrite: NEGATIVE
Protein, ur: NEGATIVE
RBC / HPF: NONE SEEN /HPF (ref 0–2)
Specific Gravity, Urine: 1.008 (ref 1.001–1.035)
pH: 7.5 (ref 5.0–8.0)

## 2021-07-26 LAB — URINE CULTURE
MICRO NUMBER:: 13221235
Result:: NO GROWTH
SPECIMEN QUALITY:: ADEQUATE

## 2021-07-26 LAB — MICROSCOPIC MESSAGE

## 2021-09-12 ENCOUNTER — Encounter: Payer: 59 | Admitting: Adult Health

## 2021-09-18 ENCOUNTER — Other Ambulatory Visit: Payer: Self-pay | Admitting: Internal Medicine

## 2021-09-18 MED ORDER — COMBIPATCH 0.05-0.14 MG/DAY TD PTTW: 1.0000 | MEDICATED_PATCH | TRANSDERMAL | 3 refills | Status: DC

## 2021-09-27 ENCOUNTER — Other Ambulatory Visit: Payer: Self-pay | Admitting: Adult Health

## 2021-09-27 DIAGNOSIS — Z9109 Other allergy status, other than to drugs and biological substances: Secondary | ICD-10-CM

## 2021-09-27 DIAGNOSIS — E785 Hyperlipidemia, unspecified: Secondary | ICD-10-CM

## 2021-09-27 DIAGNOSIS — F329 Major depressive disorder, single episode, unspecified: Secondary | ICD-10-CM

## 2021-11-13 ENCOUNTER — Ambulatory Visit: Payer: Commercial Managed Care - HMO | Admitting: Nurse Practitioner

## 2021-12-11 ENCOUNTER — Encounter (HOSPITAL_BASED_OUTPATIENT_CLINIC_OR_DEPARTMENT_OTHER): Payer: Self-pay | Admitting: Orthopedic Surgery

## 2021-12-11 ENCOUNTER — Other Ambulatory Visit: Payer: Self-pay

## 2021-12-11 ENCOUNTER — Encounter (HOSPITAL_BASED_OUTPATIENT_CLINIC_OR_DEPARTMENT_OTHER)
Admission: RE | Admit: 2021-12-11 | Discharge: 2021-12-11 | Disposition: A | Discharge status: Home / Self Care | Payer: Commercial Managed Care - HMO | Source: Ambulatory Visit | Attending: Orthopedic Surgery | Admitting: Orthopedic Surgery

## 2021-12-11 DIAGNOSIS — Z01812 Encounter for preprocedural laboratory examination: Secondary | ICD-10-CM | POA: Insufficient documentation

## 2021-12-11 DIAGNOSIS — L709 Acne, unspecified: Secondary | ICD-10-CM | POA: Insufficient documentation

## 2021-12-11 LAB — BASIC METABOLIC PANEL
Anion gap: 9 (ref 5–15)
BUN: 18 mg/dL (ref 6–20)
CO2: 27 mmol/L (ref 22–32)
Calcium: 9.8 mg/dL (ref 8.9–10.3)
Chloride: 101 mmol/L (ref 98–111)
Creatinine, Ser: 1.04 mg/dL — ABNORMAL HIGH (ref 0.44–1.00)
GFR, Estimated: >60 mL/min (ref >60)
Glucose, Bld: 115 mg/dL — ABNORMAL HIGH (ref 70–99)
Potassium: 3.7 mmol/L (ref 3.5–5.1)
Sodium: 137 mmol/L (ref 135–145)

## 2021-12-12 ENCOUNTER — Ambulatory Visit (HOSPITAL_BASED_OUTPATIENT_CLINIC_OR_DEPARTMENT_OTHER)
Admission: RE | Admit: 2021-12-12 | Payer: Commercial Managed Care - HMO | Source: Home / Self Care | Admitting: Orthopedic Surgery

## 2021-12-12 DIAGNOSIS — L709 Acne, unspecified: Secondary | ICD-10-CM

## 2021-12-12 DIAGNOSIS — Z01818 Encounter for other preprocedural examination: Secondary | ICD-10-CM

## 2021-12-12 SURGERY — OPEN REDUCTION INTERNAL FIXATION (ORIF) PROXIMAL HUMERUS FRACTURE
Anesthesia: General | Laterality: Left

## 2021-12-26 ENCOUNTER — Encounter: Payer: Commercial Managed Care - HMO | Admitting: Adult Health

## 2021-12-27 ENCOUNTER — Other Ambulatory Visit: Payer: Self-pay | Admitting: Internal Medicine

## 2021-12-27 DIAGNOSIS — L709 Acne, unspecified: Secondary | ICD-10-CM

## 2022-01-10 ENCOUNTER — Ambulatory Visit: Payer: Commercial Managed Care - HMO | Admitting: Nurse Practitioner

## 2022-01-11 ENCOUNTER — Ambulatory Visit (INDEPENDENT_AMBULATORY_CARE_PROVIDER_SITE_OTHER): Payer: Commercial Managed Care - HMO | Admitting: Nurse Practitioner

## 2022-01-11 VITALS — BP 118/76 | HR 98 | Temp 97.9°F

## 2022-01-11 DIAGNOSIS — Z9889 Other specified postprocedural states: Secondary | ICD-10-CM | POA: Diagnosis not present

## 2022-01-11 DIAGNOSIS — Z79899 Other long term (current) drug therapy: Secondary | ICD-10-CM

## 2022-01-11 DIAGNOSIS — M79602 Pain in left arm: Secondary | ICD-10-CM

## 2022-01-11 DIAGNOSIS — Z8781 Personal history of (healed) traumatic fracture: Secondary | ICD-10-CM | POA: Diagnosis not present

## 2022-01-11 MED ORDER — CYCLOBENZAPRINE HCL 5 MG PO TABS: ORAL_TABLET | ORAL | 0 refills | Status: DC

## 2022-01-11 MED ORDER — MELOXICAM 7.5 MG PO TABS: 7.5000 mg | ORAL_TABLET | Freq: Two times a day (BID) | ORAL | 0 refills | Status: DC

## 2022-01-11 NOTE — Patient Instructions (Signed)
Take Cyclobenzabrine (Muscle Relaxer) at night. Take Meloxicam (Anti-Inflammatory) in the  morning at night. Take Tyelnol 500 mg with the muscle relaxer at night and with the Meloxicam during the day.  Meloxicam can be hard on the stomach.  Take with food or an antiacid to help add gastric protection.

## 2022-01-11 NOTE — Progress Notes (Signed)
Assessment and Plan:  Erica [name] was seen today for a episodic visit.  Diagnoses and all order for this visit:  1. Left arm pain Take Cyclobenzaprine at night and Meloxicam during the day. Continue to monitor.  - cyclobenzaprine (FLEXERIL) 5 MG tablet; Take 1 tab (5 mg) daily at bedtime.  Dispense: 30 tablet; Refill: 0 - meloxicam (MOBIC) 7.5 MG tablet; Take 1 tablet (7.5 mg total) by mouth in the morning and at bedtime.  Dispense: 60 tablet; Refill: 0  2. S/P ORIF (open reduction internal fixation) fracture Continue to follow with Weston Anna as directed.  3. Medication management All medications discussed and reviewed in full. All questions and concerns regarding medications addressed.     Notify office for further evaluation and treatment, questions or concerns if s/s fail to improve. The risks and benefits of my recommendations, as well as other treatment options were discussed with the patient today. Questions were answered.  Further disposition pending results of labs. Discussed med's effects and SE's.    Over 20 minutes of exam, counseling, chart review, and critical decision making was performed.   Future Appointments  Date Time Provider [place]  01/11/2022 10:30 AM Darrol Jump, NP [facility] None  02/28/2022  9:00 AM Liane Comber, NP [facility] None  04/18/2022  8:30 AM Princess Bruins, MD [facility] None    ------------------------------------------------------------------------------------------------------------------   HPI LMP 12/20/2015  56 y.o.female presents for evaluation of ongoing pain after surgery.    She was seen in the ER 12/08/21 after a fall that cuased a Closed fracture of shaft of left humerus.  Has been in touch with Dr. Mardelle Matte for an ORIF which was completed on 12/12/21.  She was initially treated with Hydrocodone for pain management and was unable to tolerate d/t N/V.  She was provided an anti-emetic but feels as though this  just exacerbated symptoms and made her feel more fatigued.  She was then switched to Tramadol which she also gives her the same side effects.  She feels as though the medications are affecting her job.  Past Medical History:  Diagnosis Date   Allergy    Diverticulosis 12/30/2019   Environmental allergies    Fibroid    Hyperlipidemia    Osteopenia 07/2017   Score -1.5 FRAX 4.6% / 0.2%   Uterine fibroid      Allergies  Allergen Reactions   Molds & Smuts Rash    Pt was tested at allergist.    Gluten Meal     Has been tested and received a "false negative"   [place] [Fish Allergy] Itching    Hives; hypotension Also anchovies    Current Outpatient Medications on File Prior to Visit  Medication Sig   Boric Acid GRAN Once as needed (Patient not taking: Reported on 12/22/2020)   Cholecalciferol (VITAMIN D PO) Take 5,000 Units by mouth.   Cyanocobalamin (VITAMIN B-12 PO) Take by mouth.   EPINEPHrine 0.3 mg/0.3 mL IJ SOAJ injection Inject 0.3 mLs (0.3 mg total) into the muscle as needed for anaphylaxis.   estradiol-norethindrone (COMBIPATCH) 0.05-0.14 MG/DAY Place 1 patch onto the skin 2 (two) times a week.   fenofibrate (TRICOR) 145 MG tablet TAKE 1 TABLET BY MOUTH DAILY OR AS DIRECTED BY YOUR PROVIDER   fluconazole (DIFLUCAN) 150 MG tablet Take 1 tab once for vaginal yeast.   levocetirizine (XYZAL) 5 MG tablet TAKE 1 TABLET BY MOUTH DAILY FOR ALLERGIES   Multiple Vitamin (THERA) TABS Take 1 tablet by mouth.   spironolactone (  ALDACTONE) 100 MG tablet TAKE 1 AND 1/2 TABLETS(150 MG) BY MOUTH SKIN DAILY   No current facility-administered medications on file prior to visit.    ROS: all negative except what is noted in the HPI.   Physical Exam:  LMP 12/20/2015   General Appearance: NAD.  Awake, conversant and cooperative. Eyes: PERRLA, EOMs intact.  Sclera white.  Conjunctiva without erythema. Sinuses: No frontal/maxillary tenderness.  No nasal discharge. Nares patent.  ENT/Mouth: Ext  aud canals clear.  Bilateral TMs w/DOL and without erythema or bulging. Hearing intact.  Posterior pharynx without swelling or exudate.  Tonsils without swelling or erythema.  Neck: Supple.  No masses, nodules or thyromegaly. Respiratory: Effort is regular with non-labored breathing. Breath sounds are equal bilaterally without rales, rhonchi, wheezing or stridor.  Cardio: RRR with no MRGs. Brisk peripheral pulses without edema.  Abdomen: Active BS in all four quadrants.  Soft and non-tender without guarding, rebound tenderness, hernias or masses. Lymphatics: Non tender without lymphadenopathy.  Musculoskeletal: Left arm sling. Normal ambulation.  No clubbing or cyanosis. Skin: Appropriate color for ethnicity. Warm without rashes, lesions, ecchymosis, ulcers.  Neuro: CN II-XII grossly normal. Normal muscle tone without cerebellar symptoms and intact sensation.   Psych: AO X 3,  appropriate mood and affect, insight and judgment.     Darrol Jump, NP 6:00 AM [facility]

## 2022-01-15 ENCOUNTER — Encounter: Payer: BLUE CROSS/BLUE SHIELD | Admitting: Obstetrics & Gynecology

## 2022-02-16 ENCOUNTER — Other Ambulatory Visit: Payer: Self-pay | Admitting: Nurse Practitioner

## 2022-02-16 ENCOUNTER — Telehealth: Payer: Self-pay | Admitting: Nurse Practitioner

## 2022-02-16 DIAGNOSIS — M79602 Pain in left arm: Secondary | ICD-10-CM

## 2022-02-16 MED ORDER — CYCLOBENZAPRINE HCL 5 MG PO TABS: ORAL_TABLET | ORAL | 0 refills | Status: DC

## 2022-02-16 MED ORDER — MELOXICAM 7.5 MG PO TABS: 7.5000 mg | ORAL_TABLET | Freq: Two times a day (BID) | ORAL | 0 refills | Status: AC

## 2022-02-16 NOTE — Telephone Encounter (Signed)
Patient states that she started physical therapy for her arm yesterday and it has caused pain in muscles. She is requesting a refill on her muscle relaxer and Meloxicam. [facility] on [address].

## 2022-02-28 ENCOUNTER — Encounter: Payer: Commercial Managed Care - HMO | Admitting: Nurse Practitioner

## 2022-02-28 ENCOUNTER — Encounter: Payer: Commercial Managed Care - HMO | Admitting: Adult Health

## 2022-04-04 ENCOUNTER — Encounter: Payer: Self-pay | Admitting: Internal Medicine

## 2022-04-18 ENCOUNTER — Encounter: Payer: Self-pay | Admitting: Obstetrics & Gynecology

## 2022-04-18 ENCOUNTER — Telehealth: Payer: Self-pay

## 2022-04-18 ENCOUNTER — Other Ambulatory Visit (HOSPITAL_COMMUNITY)
Admission: RE | Admit: 2022-04-18 | Discharge: 2022-04-18 | Disposition: A | Discharge status: Home / Self Care | Payer: Commercial Managed Care - HMO | Source: Ambulatory Visit | Attending: Obstetrics & Gynecology | Admitting: Obstetrics & Gynecology

## 2022-04-18 ENCOUNTER — Ambulatory Visit (INDEPENDENT_AMBULATORY_CARE_PROVIDER_SITE_OTHER): Payer: Commercial Managed Care - HMO | Admitting: Obstetrics & Gynecology

## 2022-04-18 VITALS — BP 120/80 | HR 87 | Temp 98.5°F | Ht 65.0 in | Wt 149.0 lb

## 2022-04-18 DIAGNOSIS — M8589 Other specified disorders of bone density and structure, multiple sites: Secondary | ICD-10-CM | POA: Diagnosis not present

## 2022-04-18 DIAGNOSIS — Z79890 Hormone replacement therapy (postmenopausal): Secondary | ICD-10-CM | POA: Diagnosis not present

## 2022-04-18 DIAGNOSIS — Z01419 Encounter for gynecological examination (general) (routine) without abnormal findings: Secondary | ICD-10-CM

## 2022-04-18 DIAGNOSIS — Z809 Family history of malignant neoplasm, unspecified: Secondary | ICD-10-CM

## 2022-04-18 DIAGNOSIS — Z87440 Personal history of urinary (tract) infections: Secondary | ICD-10-CM

## 2022-04-18 DIAGNOSIS — N898 Other specified noninflammatory disorders of vagina: Secondary | ICD-10-CM

## 2022-04-18 LAB — WET PREP FOR TRICH, YEAST, CLUE

## 2022-04-18 MED ORDER — COMBIPATCH 0.05-0.14 MG/DAY TD PTTW: 1.0000 | MEDICATED_PATCH | TRANSDERMAL | 4 refills | Status: DC

## 2022-04-18 MED ORDER — FLUCONAZOLE 150 MG PO TABS: 150.0000 mg | ORAL_TABLET | Freq: Once | ORAL | 0 refills | Status: AC

## 2022-04-18 MED ORDER — SULFAMETHOXAZOLE-TRIMETHOPRIM 800-160 MG PO TABS: 1.0000 | ORAL_TABLET | Freq: Two times a day (BID) | ORAL | 0 refills | Status: AC

## 2022-04-18 NOTE — Addendum Note (Signed)
Addended by: Princess Bruins on: 04/18/2022 11:19 AM   Modules accepted: Orders

## 2022-04-18 NOTE — Telephone Encounter (Signed)
Referral Sent

## 2022-04-18 NOTE — Telephone Encounter (Signed)
-----   Message from Princess Bruins, MD sent at 04/18/2022  9:28 AM EST ----- Regarding: Refer to [facility] Genetic Counselor Strong Fam H/O Cancers.

## 2022-04-18 NOTE — Progress Notes (Addendum)
Erica [name] 08/05/1965 [id number]   History:    56 y.o.  G0 Single   RP:  Established patient presenting for annual gyn exam    HPI: Postmenopause, well on hormone replacement therapy with the Combipatch.  No postmenopausal bleeding.  No pelvic pain. Not currently sexually active.  Pap test done today. Complaints of discomfort with urination.  Vaginal irritation. Bowel movements normal.  Breasts normal.  Mammo Lt Neg 07/2020 and Dx mammo Rt Neg 08/2020.  Will schedule Mammo now.  Body mass index 24.79.  Physically active.  Bone Density Osteopenia AP Spine T-Score -1.5 in 07/2017.  Will repeat BD here now.  Broke her Lt humerus after a fall on concrete 4 months ago.  Mother died of Duodenal Ca, mother also had Breast Ca.  Will refer to [facility].  Health labs with family physician.  Last colonoscopy in 2016.  PAP: 07-16-17, MMG: 08-12-20, BMD: 07-23-17, COLONOSCOPY: 10-18-14 Pt c/o vaginal odor, vaginal irritation, discomfort with urination, more urination at night  Past medical history,surgical history, family history and social history were all reviewed and documented in the EPIC chart.  Gynecologic History Patient's last menstrual period was 12/20/2015.  Obstetric History OB History  Gravida Para Term Preterm AB Living  0            SAB IAB Ectopic Multiple Live Births                ROS: A ROS was performed and pertinent positives and negatives are included in the history. GENERAL: No fevers or chills. HEENT: No change in vision, no earache, sore throat or sinus congestion. NECK: No pain or stiffness. CARDIOVASCULAR: No chest pain or pressure. No palpitations. PULMONARY: No shortness of breath, cough or wheeze. GASTROINTESTINAL: No abdominal pain, nausea, vomiting or diarrhea, melena or bright red blood per rectum. GENITOURINARY: No urinary frequency, urgency, hesitancy or dysuria. MUSCULOSKELETAL: No joint or muscle pain, no back pain, no recent trauma. DERMATOLOGIC: No rash, no  itching, no lesions. ENDOCRINE: No polyuria, polydipsia, no heat or cold intolerance. No recent change in weight. HEMATOLOGICAL: No anemia or easy bruising or bleeding. NEUROLOGIC: No headache, seizures, numbness, tingling or weakness. PSYCHIATRIC: No depression, no loss of interest in normal activity or change in sleep pattern.     Exam:   BP 120/80   Pulse 87   Temp 98.5 F (36.9 C) (Oral)   Ht '5\' 5"'$  (1.651 m)   Wt 149 lb (67.6 kg)   LMP 12/20/2015   SpO2 97%   BMI 24.79 kg/m   Body mass index is 24.79 kg/m.  General appearance : Well developed well nourished female. No acute distress HEENT: Eyes: no retinal hemorrhage or exudates,  Neck supple, trachea midline, no carotid bruits, no thyroidmegaly Lungs: Clear to auscultation, no rhonchi or wheezes, or rib retractions  Heart: Regular rate and rhythm, no murmurs or gallops Breast:Examined in sitting and supine position were symmetrical in appearance, no palpable masses or tenderness,  no skin retraction, no nipple inversion, no nipple discharge, no skin discoloration, no axillary or supraclavicular lymphadenopathy Abdomen: no palpable masses or tenderness, no rebound or guarding Extremities: no edema or skin discoloration or tenderness  Pelvic: Vulva: Normal             Vagina: No gross lesions or discharge  Cervix: No gross lesions or discharge.  Pap reflex done.  Uterus  AV, normal size, shape and consistency, non-tender and mobile  Adnexa  Without masses or tenderness  Anus: Normal  U/A: Dark yellow, cloudy, Nit Neg, Pro Neg, WBC 6-10, RBC 0-2, Bacteria many.  Pending U. Culture  Wet Prep Negative   Assessment/Plan:  56 y.o. female for annual exam   1. Encounter for routine gynecological examination with Papanicolaou smear of cervix Postmenopause, well on hormone replacement therapy with the Combipatch.  No postmenopausal bleeding.  No pelvic pain. Not currently sexually active.  Pap test done today. Complaints of  discomfort with urination.  Vaginal irritation. Bowel movements normal.  Breasts normal.  Mammo Lt Neg 07/2020 and Dx mammo Rt Neg 08/2020.  Will schedule Mammo now.  Body mass index 24.79.  Physically active.  Bone Density Osteopenia AP Spine T-Score -1.5 in 07/2017.  Will repeat BD here now.  Broke her Lt humerus after a fall on concrete 4 months ago.  Mother died of Duodenal Ca, mother also had Breast Ca.  Will refer to [facility] [facility].  Health labs with family physician.  Last colonoscopy in 2016. - Pap test reflex  2. Postmenopausal hormone replacement therapy Postmenopause, well on hormone replacement therapy with the Combipatch.  No postmenopausal bleeding.  No pelvic pain. Not currently sexually active.    3. Osteopenia of multiple sites Body mass index 24.79.  Physically active.  Bone Density Osteopenia AP Spine T-Score -1.5 in 07/2017.  Will repeat BD here now.  Broke her Lt humerus after a fall on concrete 4 months ago. - DG Bone Density; Future  4. History of UTI U/A abnormal.  Will treat with Bactrim DS. Fluconazole x 1 after. - Urinalysis,Complete w/RFL Culture  5. Vaginal irritation Wet prep Neg. - WET PREP FOR TRICH, YEAST, CLUE  Other orders - traMADol (ULTRAM) 50 MG tablet; Take 50 mg by mouth every 6 (six) hours as needed. (Patient not taking: Reported on 04/18/2022) - tazarotene (AVAGE) 0.1 % cream; Apply topically at bedtime. - naproxen sodium (ALEVE) 220 MG tablet; Take by mouth. - meloxicam (MOBIC) 15 MG tablet; Take by mouth. - sulfamethoxazole-trimethoprim (BACTRIM DS) 800-160 MG tablet; Take 1 tablet by mouth 2 (two) times daily for 3 days. - fluconazole (DIFLUCAN) 150 MG tablet; Take 1 tablet (150 mg total) by mouth once for 1 dose. - estradiol-norethindrone (COMBIPATCH) 0.05-0.14 MG/DAY; Place 1 patch onto the skin 2 (two) times a week.    Princess Bruins MD, 8:44 AM

## 2022-04-19 LAB — CYTOLOGY - PAP: Diagnosis: NEGATIVE

## 2022-04-20 LAB — URINE CULTURE
MICRO NUMBER:: 14360345
SPECIMEN QUALITY:: ADEQUATE

## 2022-04-20 LAB — URINALYSIS, COMPLETE W/RFL CULTURE
Bilirubin Urine: NEGATIVE
Glucose, UA: NEGATIVE
Hgb urine dipstick: NEGATIVE
Hyaline Cast: NONE SEEN /LPF
Nitrites, Initial: NEGATIVE
Protein, ur: NEGATIVE
Specific Gravity, Urine: 1.02 (ref 1.001–1.035)
pH: 6 (ref 5.0–8.0)

## 2022-04-20 LAB — CULTURE INDICATED

## 2022-04-24 ENCOUNTER — Other Ambulatory Visit: Payer: Self-pay | Admitting: Nurse Practitioner

## 2022-04-24 ENCOUNTER — Telehealth: Payer: Self-pay | Admitting: Nurse Practitioner

## 2022-04-24 DIAGNOSIS — M79602 Pain in left arm: Secondary | ICD-10-CM

## 2022-04-24 MED ORDER — CYCLOBENZAPRINE HCL 5 MG PO TABS: ORAL_TABLET | ORAL | 0 refills | Status: AC

## 2022-04-24 NOTE — Telephone Encounter (Signed)
Pt is requesting a refill on muscle relaxer until appt next Monday

## 2022-04-25 ENCOUNTER — Encounter: Payer: 59 | Admitting: Nurse Practitioner

## 2022-04-27 NOTE — Telephone Encounter (Signed)
FYI. Pt scheduled for 06/05/2022.

## 2022-05-01 ENCOUNTER — Ambulatory Visit (INDEPENDENT_AMBULATORY_CARE_PROVIDER_SITE_OTHER): Payer: 59 | Admitting: Nurse Practitioner

## 2022-05-01 ENCOUNTER — Encounter: Payer: Self-pay | Admitting: Nurse Practitioner

## 2022-05-01 VITALS — BP 110/68 | HR 92 | Temp 97.7°F | Ht 65.0 in | Wt 153.2 lb

## 2022-05-01 DIAGNOSIS — L709 Acne, unspecified: Secondary | ICD-10-CM

## 2022-05-01 DIAGNOSIS — Z0001 Encounter for general adult medical examination with abnormal findings: Secondary | ICD-10-CM

## 2022-05-01 DIAGNOSIS — Z9889 Other specified postprocedural states: Secondary | ICD-10-CM

## 2022-05-01 DIAGNOSIS — F439 Reaction to severe stress, unspecified: Secondary | ICD-10-CM

## 2022-05-01 DIAGNOSIS — E559 Vitamin D deficiency, unspecified: Secondary | ICD-10-CM

## 2022-05-01 DIAGNOSIS — Z131 Encounter for screening for diabetes mellitus: Secondary | ICD-10-CM | POA: Diagnosis not present

## 2022-05-01 DIAGNOSIS — Z0000 Encounter for general adult medical examination without abnormal findings: Secondary | ICD-10-CM | POA: Diagnosis not present

## 2022-05-01 DIAGNOSIS — Z136 Encounter for screening for cardiovascular disorders: Secondary | ICD-10-CM | POA: Diagnosis not present

## 2022-05-01 DIAGNOSIS — I10 Essential (primary) hypertension: Secondary | ICD-10-CM | POA: Diagnosis not present

## 2022-05-01 DIAGNOSIS — Z8781 Personal history of (healed) traumatic fracture: Secondary | ICD-10-CM

## 2022-05-01 DIAGNOSIS — Z1329 Encounter for screening for other suspected endocrine disorder: Secondary | ICD-10-CM

## 2022-05-01 DIAGNOSIS — Z1389 Encounter for screening for other disorder: Secondary | ICD-10-CM

## 2022-05-01 DIAGNOSIS — M8588 Other specified disorders of bone density and structure, other site: Secondary | ICD-10-CM

## 2022-05-01 DIAGNOSIS — E785 Hyperlipidemia, unspecified: Secondary | ICD-10-CM

## 2022-05-01 DIAGNOSIS — M545 Low back pain, unspecified: Secondary | ICD-10-CM

## 2022-05-01 DIAGNOSIS — Z780 Asymptomatic menopausal state: Secondary | ICD-10-CM

## 2022-05-01 DIAGNOSIS — Z91018 Allergy to other foods: Secondary | ICD-10-CM

## 2022-05-01 DIAGNOSIS — Z79899 Other long term (current) drug therapy: Secondary | ICD-10-CM

## 2022-05-01 DIAGNOSIS — G8929 Other chronic pain: Secondary | ICD-10-CM

## 2022-05-01 DIAGNOSIS — D219 Benign neoplasm of connective and other soft tissue, unspecified: Secondary | ICD-10-CM

## 2022-05-01 DIAGNOSIS — F329 Major depressive disorder, single episode, unspecified: Secondary | ICD-10-CM

## 2022-05-01 DIAGNOSIS — Z9109 Other allergy status, other than to drugs and biological substances: Secondary | ICD-10-CM

## 2022-05-01 NOTE — Progress Notes (Signed)
Complete Physical  Assessment and Plan:  Encounter for general adult medical examination with abnormal findings Due annually Health Maintenance reviewed  Hyperlipidemia, unspecified hyperlipidemia type Discussed lifestyle modifications. Recommended diet heavy in fruits and veggies, omega 3's. Decrease consumption of animal meats, cheeses, and dairy products. Remain active and exercise as tolerated. Continue to monitor. Check lipids/TSH  Vitamin D deficiency Continue supplement Monitor levels  Environmental allergies Avoid triggers Continue OTC antihistamines PRN   Acne, unspecified acne type On spirolactone Derm following Monitor K+  Chronic right-sided low back pain without sciatica Can follow up with PT/strength and do proper lifting techniques  Fibroid GYN following  Food allergy - gluten Avoid gluten, celiac panel was negative Due for colonoscopy - defer at this time until shoulder repair completed.    Screening for hematuria or proteinuria -     Urinalysis, Routine w reflex microscopic  Screening for thyroid disorder/ family hx of thyroid disorder -     TSH  Medication management All medications discussed and reviewed in full. All questions and concerns regarding medications addressed.     Osteopenia Pursue a combination of weight-bearing exercises and strength training. Advised on fall prevention measures including proper lighting in all rooms, removal of area rugs and floor clutter, use of walking devices as deemed appropriate, avoidance of uneven walking surfaces. Smoking cessation and moderate alcohol consumption if applicable Consume 622 to 1000 IU of vitamin D daily with a goal vitamin D serum value of 30 ng/mL or higher. Aim for 1000 to 1200 mg of elemental calcium daily through supplements and/or dietary sources. Due - to discuss with GYN  Stress and adjustment reaction/ depression Stress management techniques discussed, increase water, good  sleep hygiene discussed, increase exercise, and increase veggies.    Post menopause symptoms Continue estradiol-norethindrone (COMBIPATCH)   S/P ORIF Ortho following Continue alternative treatments  Screening for DM Check and monitor A1c  Screening for cardiovascular condition EKG  Orders Placed This Encounter  Procedures   CBC with Differential/Platelet   COMPLETE METABOLIC PANEL WITH GFR   Lipid panel   TSH   Hemoglobin A1c   Insulin, random   VITAMIN D 25 Hydroxy (Vit-D Deficiency, Fractures)   Urinalysis, Routine w reflex microscopic   Microalbumin / creatinine urine ratio   EKG 12-Lead      Notify office for further evaluation and treatment, questions or concerns if any reported s/s fail to improve.   The patient was advised to call back or seek an in-person evaluation if any symptoms worsen or if the condition fails to improve as anticipated.   Further disposition pending results of labs. Discussed med's effects and SE's.    I discussed the assessment and treatment plan with the patient. The patient was provided an opportunity to ask questions and all were answered. The patient agreed with the plan and demonstrated an understanding of the instructions.  Discussed med's effects and SE's. Screening labs and tests as requested with regular follow-up as recommended.  I provided 35 minutes of face-to-face time during this encounter including counseling, chart review, and critical decision making was preformed.    Future Appointments  Date Time Provider [place]  06/05/2022  1:00 PM Erica Erica [name], Counselor [facility] None  06/05/2022  2:00 PM [facility] LAB [facility] None  11/07/2022  4:00 PM Darrol Jump, NP [facility] None  05/02/2023  9:00 AM Erica Erica [name], Kenney Houseman, NP [facility] None     HPI  57 y.o. Erica [name]  presents for a complete physical. She has Environmental  allergies; Hyperlipidemia; Vitamin D deficiency; Acne; Food allergy; Low back pain;  Diverticulosis; Gluten intolerance; Diarrhea; Post-menopausal; Osteopenia of lumbar spine; Major depression in partial remission ([place]); Grief reaction; and Recurrent candidiasis of vagina on their problem list.  She is single, never married/no children. Not currently dating. She is currently studying interior Academic librarian, has degree in interior architecture.  Working in [place], feels travel time is too much.  Looking to relocate/move but cannot at this time d/t most recent left shoulder ORIF.  Saw Erica Erica [name] 02/05/22 for left humerus open reduction and internal fixation surgery 12/12/2021.  She was transitioned out of the sling. Has completed PT.  Followed up with Erica Erica [name] on 03/07/22.  Reports improvement in external rotation.  Discussed that injections or manipulation would be of any benefit.  Will be a 6 mo - 1 year recovery. She has transitioned to a home exercise program.  Has not had a good experience with PT.  She will f/u in 4 weeks. She was started on Meloxicam and Tramadol.  She is no longer taking Tramadol.  She has met with a energy therapist and is looking into starting acupuncture.  She will also be meeting with a nutritionalist to aide ina more holistic approach to recovery.  She has met once with the energy therapist and feels very good about moving forward.    She has had a rough year emotionally, mother recently passed.  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 She has done some therapy/grief counseling, currently managing with meditation, prayers. She has taken Zoloft in the past but not currently on any medication for mood.  Feels as though overall, stable.   She is post menopausal, on OTC hormonal therapy for hot flashes vir GYN at [facility] but hasn't followed up since 2019. Not sexually active, last PAP was HPV neg in 2019. Getting regular mammorgrams at [facility]. We are prescribing in the interim.   She has chronic  lower back pain and has seen Dr. Nelva Bush in the past.   She is on spirolactone for her acne and follows with Dr. Ubaldo Glassing, requests potassium check annually here.   In 2021 she had abdominal pain followed by persistent diarrhea for 6 months, rapid covid 19 and GI pathogen panel was Negative. CRP 28.2 and ESR 43 were both elevated. She had negative celiac panel. She was referred to [facility] but colonoscopy/EGD was going to cost $1000 for facility charge fee. She reports sx abruptly resolved after about 6 months and declined further workup. She had colonoscopy completed 09/2014 with a 5 year recall.  Overdue since 2021.  She is planning to update once shoulder is more stable.  Reports her sister informed her of multiple family members now with duodenal cancer.    BMI is Body mass index is 25.49 kg/m., she has been working on diet and exercise. Wt Readings from Last 3 Encounters:  05/01/22 153 lb 3.2 oz (69.5 kg)  04/18/22 149 lb (67.6 kg)  12/11/21 145 lb (Erica.8 kg)   Her blood pressure has been controlled at home, today their BP is BP: 110/68 She does not workout due to lower back pain.  She denies chest pain, shortness of breath, dizziness.   She is on cholesterol medication (fenofibrate) and denies myalgias. Her cholesterol is at goal. The cholesterol last visit was:   Lab Results  Component Value Date   CHOL 234 (H) 12/22/2020   HDL 61 12/22/2020   LDLCALC 153 (  H) 12/22/2020   TRIG 91 12/22/2020   CHOLHDL 3.8 12/22/2020    Patient is not on Vitamin D supplement, 5000 IU daily.  Lab Results  Component Value Date   VD25OH 27 (L) 12/22/2020      Current Medications:  Current Outpatient Medications on File Prior to Visit  Medication Sig   Calcium Carbonate (CALCIUM 500 PO) Take by mouth daily.   Cholecalciferol (VITAMIN D PO) Take 5,000 Units by mouth.   cyclobenzaprine (FLEXERIL) 5 MG tablet Take 1 tab (5 mg) daily at bedtime.   EPINEPHrine 0.3 mg/0.3 mL IJ SOAJ injection Inject 0.3  mLs (0.3 mg total) into the muscle as needed for anaphylaxis.   estradiol-norethindrone (COMBIPATCH) 0.05-0.14 MG/DAY Place 1 patch onto the skin 2 (two) times a week.   fenofibrate (TRICOR) 145 MG tablet TAKE 1 TABLET BY MOUTH DAILY OR AS DIRECTED BY YOUR PROVIDER   levocetirizine (XYZAL) 5 MG tablet TAKE 1 TABLET BY MOUTH DAILY FOR ALLERGIES   meloxicam (MOBIC) 15 MG tablet Take by mouth.   Multiple Vitamin (THERA) TABS Take 1 tablet by mouth.   naproxen sodium (ALEVE) 220 MG tablet Take by mouth.   spironolactone (ALDACTONE) 100 MG tablet TAKE 1 AND 1/2 TABLETS(150 MG) BY MOUTH SKIN DAILY   tazarotene (AVAGE) 0.1 % cream Apply topically at bedtime.   Cyanocobalamin (VITAMIN B-12 PO) Take by mouth. (Patient not taking: Reported on 05/01/2022)   traMADol (ULTRAM) 50 MG tablet Take 50 mg by mouth every 6 (six) hours as needed. (Patient not taking: Reported on 04/18/2022)   No current facility-administered medications on file prior to visit.   Immunization History  Administered Date(s) Administered   Influenza Split 01/19/2015   Influenza, Seasonal, Injecte, Preservative Fre 05/02/2016   Influenza,inj,Quad PF,6+ Mos 03/08/2021   Influenza-Unspecified 12/22/2017   PFIZER(Purple Top)SARS-COV-2 Vaccination 07/04/2019, 07/25/2019, 12/17/2019   Tdap 05/14/2017    Health Maintenance:   Tetanus: 04/2017 Pneumovax: N/A Prevnar 13: N/A Flu vaccine: 2018 Due Shingrix: Ask insurance about shingrix Covid 19: 2/2, pfizer + booster this year, will call with date  LMP: 2015 post meonpausal Pap: 06/2017 negative HPV, GYN MGM: 08/2020 Due - patient plans to schedule DEXA: 07/2017, Spine T score -1.4, osteopenia - due patient plans to schedule  Colonoscopy: 2016 1 sessile polyp, Dr. Henrene Pastor, cost was barrier, declined this year EGD: N/A  Last Dental Exam: Dr. Garry Heater, q 6 months Last Eye Exam: [facility], glasses, q 1-2 years Last derm: Dr. Ubaldo Glassing, goes annually [facility]    Patient Care Team: Unk Pinto, MD as PCP - General (Internal Medicine) Phineas Real, Belinda Block, MD (Inactive) as Consulting Physician (Gynecology) Suella Broad, MD as Consulting Physician (Physical Medicine and Rehabilitation) Rolm Bookbinder, MD as Consulting Physician (Dermatology) Bobbitt, Sedalia Muta, MD as Consulting Physician (Allergy and Immunology) Irene Shipper, MD as Consulting Physician (Gastroenterology)  Medical History:  Past Medical History:  Diagnosis Date   Allergy    Diverticulosis 12/30/2019   Environmental allergies    Fibroid    Hyperlipidemia    Osteopenia 07/2017   Score -1.5 FRAX 4.6% / 0.2%   Uterine fibroid    Allergies Allergies  Allergen Reactions   Molds & Smuts Rash    Pt was tested at allergist.    Gluten Meal     Has been tested and received a "false negative"   Jordan [Fish Allergy] Itching    Hives; hypotension Also anchovies    SURGICAL HISTORY She  has a past surgical history  that includes Appendectomy; Myomectomy; Hysteroscopy (04/23/2005); and arm surgery. FAMILY HISTORY Her family history includes Breast cancer in her mother; Cancer in her maternal grandmother, maternal uncle, mother, and another family member; Heart disease in her maternal grandmother; Hyperlipidemia in her maternal grandmother; Hypertension in her maternal grandmother; Lymphoma in her paternal aunt; Thyroid disease in her maternal grandmother. SOCIAL HISTORY She  reports that she has never smoked. She has never used smokeless tobacco. She reports that she does not currently use alcohol. She reports that she does not use drugs.  Review of Systems: Review of Systems  Constitutional:  Negative for malaise/fatigue and weight loss.  HENT:  Negative for hearing loss and tinnitus.   Eyes:  Negative for blurred vision and double vision.  Respiratory:  Negative for cough, sputum production, shortness of breath and wheezing.   Cardiovascular:  Negative for chest pain,  palpitations, orthopnea, claudication, leg swelling and PND.  Gastrointestinal:  Negative for abdominal pain, blood in stool, constipation, diarrhea, heartburn, melena, nausea and vomiting.  Genitourinary: Negative.        Vaginal odor without itching or discharge  Musculoskeletal:  Negative for falls, joint pain and myalgias.  Skin:  Negative for rash.  Neurological:  Negative for dizziness, tingling, sensory change, weakness and headaches.  Endo/Heme/Allergies:  Negative for polydipsia.  Psychiatric/Behavioral:  Positive for depression. Negative for memory loss, substance abuse and suicidal ideas. The patient is not nervous/anxious and does not have insomnia.   All other systems reviewed and are negative.   Physical Exam: Estimated body mass index is 25.49 kg/m as calculated from the following:   Height as of this encounter: '5\' 5"'$  (1.651 m).   Weight as of this encounter: 153 lb 3.2 oz (69.5 kg). BP 110/68   Pulse 92   Temp 97.7 F (36.5 C)   Ht '5\' 5"'$  (1.651 m)   Wt 153 lb 3.2 oz (69.5 kg)   LMP 12/20/2015   SpO2 97%   BMI 25.49 kg/m  General Appearance: Well nourished, in no apparent distress.  Eyes: PERRLA, EOMs, conjunctiva no swelling or erythema, normal fundi and vessels.  Sinuses: No Frontal/maxillary tenderness  ENT/Mouth: Ext aud canals clear, normal light reflex with TMs without erythema, bulging. Good dentition. No erythema, swelling, or exudate on post pharynx. Tonsils not swollen or erythematous. Hearing normal.  Neck: Supple, thyroid normal. No bruits  Respiratory: Respiratory effort normal, BS equal bilaterally without rales, rhonchi, wheezing or stridor.  Cardio: RRR without murmurs, rubs or gallops. Brisk peripheral pulses without edema.  Chest: symmetric, with normal excursions and percussion.  Breasts: defer  Abdomen: Soft, nontender, no guarding, rebound, hernias, masses, or organomegaly.  Lymphatics: Non tender without lymphadenopathy.  Genitourinary:  defer Musculoskeletal: Full ROM all peripheral extremities,5/5 strength, and normal gait.  Skin: Warm, dry without rashes, lesions, ecchymosis. Neuro: Cranial nerves intact, reflexes equal bilaterally. Normal muscle tone, no cerebellar symptoms. Sensation intact.  Psych: Awake and oriented X 3, depressed affect, Insight and Judgment appropriate.   EKG: WNL NSR  Opel Lejeune 1:52 PM [facility]

## 2022-05-01 NOTE — Patient Instructions (Addendum)
You may take up to 4,000 mg of Tylenol in 24 hours.   Acetaminophen Capsules or Tablets What is this medication? ACETAMINOPHEN (a set a MEE noe fen) treats mild to moderate pain. It may also be used to reduce fever. This medicine may be used for other purposes; ask your health care provider or pharmacist if you have questions. COMMON BRAND NAME(S): Aceta, Actamin, Anacin Aspirin Free, Genapap, Genebs, Mapap, Pain & Fever, Pain and Fever, PAIN RELIEF, PAIN RELIEF Extra Strength, Pain Reliever, Panadol, PHARBETOL, Plus PHARMA, Q-Pap, Q-Pap Extra Strength, Tylenol, Tylenol CrushableTablet, Tylenol Extra Strength, Tylenol Regular Strength, XS No Aspirin, XS Pain Reliever What should I tell my care team before I take this medication? They need to know if you have any of these conditions: Frequently drink alcohol Liver disease An unusual or allergic reaction to acetaminophen, other medications, foods, dyes, or preservatives Pregnant or trying to get pregnant Breastfeeding How should I use this medication? Take this medication by mouth with a glass of water. Take it as directed on the package or prescription label. Take your medication at regular intervals. Do not take your medication more often than directed. Talk to your care team about the use of this medication in children. While it may be prescribed for children as young as 36 years of age for selected conditions, precautions do apply. Overdosage: If you think you have taken too much of this medicine contact a poison control center or emergency room at once. NOTE: This medicine is only for you. Do not share this medicine with others. What if I miss a dose? If you miss a dose, take it as soon as you can. If it is almost time for your next dose, take only that dose. Do not take double or extra doses. What may interact with this medication? Alcohol Imatinib Isoniazid Other medications with acetaminophen This list may not describe all possible  interactions. Give your health care provider a list of all the medicines, herbs, non-prescription drugs, or dietary supplements you use. Also tell them if you smoke, drink alcohol, or use illegal drugs. Some items may interact with your medicine. What should I watch for while using this medication? Tell your care team if the pain lasts more than 10 days (5 days for children), if it gets worse, or if there is a new or different kind of pain. Also, check with your care team if a fever lasts for more than 3 days. Do not take other medications that contain acetaminophen with this medication. Many non-prescription medications contain acetaminophen. Always read labels carefully. If you have questions, ask your care team. If you take too much acetaminophen, get medical help right away. Too much acetaminophen can be very dangerous and cause liver damage. Even if you do not have symptoms, it is important to get help right away. What side effects may I notice from receiving this medication? Side effects that you should report to your care team as soon as possible: Allergic reactions--skin rash, itching, hives, swelling of the face, lips, tongue, or throat Liver injury--right upper belly pain, loss of appetite, nausea, light-colored stool, dark yellow or brown urine, yellowing skin or eyes, unusual weakness or fatigue Redness, blistering, peeling, or loosening of the skin, including inside the mouth Side effects that usually do not require medical attention (report to your care team if they continue or are bothersome): Headache Nausea Trouble sleeping Upset stomach This list may not describe all possible side effects. Call your doctor for medical advice about  side effects. You may report side effects to FDA at 1-800-FDA-1088. Where should I keep my medication? Keep out of the reach of children and pets. Store at room temperature between 20 and 25 degrees C (68 and 77 degrees F). Protect from moisture and  heat. Get rid of any unused medication after the expiration date. To get rid of medications that are no longer wanted or have expired: Take the medication to a medication take-back program. Ask your pharmacy or law enforcement to find a location. If you cannot return the medication, check the label or package insert to see if the medication should be thrown out in the garbage or flushed down the toilet. If you are not sure, ask your care team. If it is safe to put it in the trash, empty the medication out of the container. Mix the medication with cat litter, dirt, coffee grounds, or other unwanted substance. Put it in the trash. NOTE: This sheet is a summary. It may not cover all possible information. If you have questions about this medicine, talk to your doctor, pharmacist, or health care provider.  2023 Elsevier/Gold Standard (2020-04-06 00:00:00)

## 2022-05-02 LAB — CBC WITH DIFFERENTIAL/PLATELET
Absolute Monocytes: 525 cells/uL (ref 200–950)
Basophils Absolute: 49 cells/uL (ref 0–200)
Basophils Relative: 0.8 %
Eosinophils Absolute: 140 cells/uL (ref 15–500)
Eosinophils Relative: 2.3 %
HCT: 36.2 % (ref 35.0–45.0)
Hemoglobin: 12.2 g/dL (ref 11.7–15.5)
Lymphs Abs: 1989 cells/uL (ref 850–3900)
MCH: 31.4 pg (ref 27.0–33.0)
MCHC: 33.7 g/dL (ref 32.0–36.0)
MCV: 93.3 fL (ref 80.0–100.0)
MPV: 9.7 fL (ref 7.5–12.5)
Monocytes Relative: 8.6 %
Neutro Abs: 3398 cells/uL (ref 1500–7800)
Neutrophils Relative %: 55.7 %
Platelets: 480 10*3/uL — ABNORMAL HIGH (ref 140–400)
RBC: 3.88 10*6/uL (ref 3.80–5.10)
RDW: 12.3 % (ref 11.0–15.0)
Total Lymphocyte: 32.6 %
WBC: 6.1 10*3/uL (ref 3.8–10.8)

## 2022-05-02 LAB — COMPLETE METABOLIC PANEL WITH GFR
AG Ratio: 1.8 (calc) (ref 1.0–2.5)
ALT: 22 U/L (ref 6–29)
AST: 19 U/L (ref 10–35)
Albumin: 4.8 g/dL (ref 3.6–5.1)
Alkaline phosphatase (APISO): 59 U/L (ref 37–153)
BUN: 23 mg/dL (ref 7–25)
CO2: 28 mmol/L (ref 20–32)
Calcium: 10.5 mg/dL — ABNORMAL HIGH (ref 8.6–10.4)
Chloride: 104 mmol/L (ref 98–110)
Creat: 0.77 mg/dL (ref 0.50–1.03)
Globulin: 2.6 g/dL (calc) (ref 1.9–3.7)
Glucose, Bld: 81 mg/dL (ref 65–99)
Potassium: 4.9 mmol/L (ref 3.5–5.3)
Sodium: 141 mmol/L (ref 135–146)
Total Bilirubin: 0.4 mg/dL (ref 0.2–1.2)
Total Protein: 7.4 g/dL (ref 6.1–8.1)
eGFR: 89 mL/min/{1.73_m2} (ref >=60)

## 2022-05-02 LAB — LIPID PANEL
Cholesterol: 190 mg/dL (ref <200)
HDL: 55 mg/dL (ref >=50)
LDL Cholesterol (Calc): 115 mg/dL (calc) — ABNORMAL HIGH
Non-HDL Cholesterol (Calc): 135 mg/dL (calc) — ABNORMAL HIGH (ref <130)
Total CHOL/HDL Ratio: 3.5 (calc) (ref <5.0)
Triglycerides: 95 mg/dL (ref <150)

## 2022-05-02 LAB — URINALYSIS, ROUTINE W REFLEX MICROSCOPIC
Bilirubin Urine: NEGATIVE
Glucose, UA: NEGATIVE
Hgb urine dipstick: NEGATIVE
Ketones, ur: NEGATIVE
Leukocytes,Ua: NEGATIVE
Nitrite: NEGATIVE
Protein, ur: NEGATIVE
Specific Gravity, Urine: 1.012 (ref 1.001–1.035)
pH: 7.5 (ref 5.0–8.0)

## 2022-05-02 LAB — MICROALBUMIN / CREATININE URINE RATIO
Creatinine, Urine: 41 mg/dL (ref 20–275)
Microalb, Ur: <0.2 mg/dL

## 2022-05-02 LAB — VITAMIN D 25 HYDROXY (VIT D DEFICIENCY, FRACTURES): Vit D, 25-Hydroxy: 53 ng/mL (ref 30–100)

## 2022-05-02 LAB — HEMOGLOBIN A1C
Hgb A1c MFr Bld: 5.5 % of total Hgb (ref <5.7)
Mean Plasma Glucose: 111 mg/dL
eAG (mmol/L): 6.2 mmol/L

## 2022-05-02 LAB — TSH: TSH: 0.92 mIU/L (ref 0.40–4.50)

## 2022-05-02 LAB — INSULIN, RANDOM: Insulin: 5.4 u[IU]/mL

## 2022-06-05 ENCOUNTER — Encounter: Payer: Commercial Managed Care - HMO | Admitting: Genetic Counselor

## 2022-06-05 ENCOUNTER — Other Ambulatory Visit: Payer: Commercial Managed Care - HMO

## 2022-06-07 DIAGNOSIS — M25612 Stiffness of left shoulder, not elsewhere classified: Secondary | ICD-10-CM | POA: Diagnosis not present

## 2022-06-07 DIAGNOSIS — M6281 Muscle weakness (generalized): Secondary | ICD-10-CM | POA: Diagnosis not present

## 2022-06-07 DIAGNOSIS — S42202D Unspecified fracture of upper end of left humerus, subsequent encounter for fracture with routine healing: Secondary | ICD-10-CM | POA: Diagnosis not present

## 2022-06-13 DIAGNOSIS — S42202D Unspecified fracture of upper end of left humerus, subsequent encounter for fracture with routine healing: Secondary | ICD-10-CM | POA: Diagnosis not present

## 2022-06-13 DIAGNOSIS — M545 Low back pain, unspecified: Secondary | ICD-10-CM | POA: Diagnosis not present

## 2022-07-06 DIAGNOSIS — M6281 Muscle weakness (generalized): Secondary | ICD-10-CM | POA: Diagnosis not present

## 2022-07-06 DIAGNOSIS — M25612 Stiffness of left shoulder, not elsewhere classified: Secondary | ICD-10-CM | POA: Diagnosis not present

## 2022-07-06 DIAGNOSIS — S42202D Unspecified fracture of upper end of left humerus, subsequent encounter for fracture with routine healing: Secondary | ICD-10-CM | POA: Diagnosis not present

## 2022-07-16 DIAGNOSIS — M545 Low back pain, unspecified: Secondary | ICD-10-CM | POA: Diagnosis not present

## 2022-07-16 DIAGNOSIS — S42202D Unspecified fracture of upper end of left humerus, subsequent encounter for fracture with routine healing: Secondary | ICD-10-CM | POA: Diagnosis not present

## 2022-07-26 ENCOUNTER — Ambulatory Visit: Payer: 59 | Admitting: Nurse Practitioner

## 2022-08-08 DIAGNOSIS — S39012D Strain of muscle, fascia and tendon of lower back, subsequent encounter: Secondary | ICD-10-CM | POA: Diagnosis not present

## 2022-08-17 ENCOUNTER — Other Ambulatory Visit: Payer: Self-pay

## 2022-08-17 ENCOUNTER — Other Ambulatory Visit: Payer: Self-pay | Admitting: Obstetrics & Gynecology

## 2022-08-17 DIAGNOSIS — Z79890 Hormone replacement therapy (postmenopausal): Secondary | ICD-10-CM

## 2022-08-17 MED ORDER — COMBIPATCH 0.05-0.14 MG/DAY TD PTTW
1.0000 | MEDICATED_PATCH | TRANSDERMAL | 2 refills | Status: DC
Start: 2022-08-20 — End: 2022-09-05

## 2022-08-17 NOTE — Telephone Encounter (Signed)
Pt calling to report needing combi patches transferred to [facility] on [address] due to insurance/job change states she has refills at the [facility] but for some reason they're not transferring.  Last AEX 04/18/2022--recall placed for 2024. Last mammo 07/2020--pt aware needs to schedule. Will write up order and place on your desk for authorization.   Rx pend.

## 2022-08-21 ENCOUNTER — Other Ambulatory Visit: Payer: Self-pay | Admitting: Obstetrics & Gynecology

## 2022-08-21 DIAGNOSIS — Z79890 Hormone replacement therapy (postmenopausal): Secondary | ICD-10-CM

## 2022-08-21 NOTE — Telephone Encounter (Signed)
Screening mammo order authorized by ML on 08/17/22 and faxed successfully on 08/20/22.   Confirmed that [facility] did receive order but pt is not yet scheduled for screening mammo.

## 2022-08-22 NOTE — Telephone Encounter (Signed)
RF note from pharmacy states the prescription needs a prior authorization.  Message sent to [name].

## 2022-08-22 NOTE — Telephone Encounter (Signed)
Rx was approved & sent in a few days ago. So far no prior authorization has come in from pharmacy. Will wait & check tomorrow

## 2022-08-27 DIAGNOSIS — S42295A Other nondisplaced fracture of upper end of left humerus, initial encounter for closed fracture: Secondary | ICD-10-CM | POA: Diagnosis not present

## 2022-08-27 NOTE — Telephone Encounter (Signed)
Pt came into office today wondering why a PA has not been initiated. Advised pt that Dr. Sharol Roussel assistant was waiting on PA to be initiated through pharmacy and sent to [place]. However, no documentation that one has came so will initiate today and call her with results. Pt voiced understanding and appreciation.  LDVM on machine per DPR for her to [facility] and confirm her current insurance.   PA initiated through covermymeds. KEY: [id number]

## 2022-08-28 NOTE — Telephone Encounter (Signed)
I have reached a point in PA process for pt's HRT [facility]) where they are inquiring about failed attempts/reactions to alternative HRTs prior to this rx. Pt advised via DVM per DPR that I found where she was taking/using the climara patch along w/ progesterone and that cause some severe bloat but did not see anymore HRT prior to that, only BCPs.  Advised her to contact back with anymore info that can be provided towards PA.

## 2022-08-28 NOTE — Telephone Encounter (Signed)
Spoke w/ pt and she confirmed that the [facility] that we have on file is her current/correct insurance.   Pt advised that through covermymeds, the initial PA reported that a PA was not required for this medication. However, she inquired as to why her pharmacy was telling her that they would not cover rx. I advised her that the program will allow me to continue with a PA even though it initially states that one is not needed and that I will notify her of the outcome once received. She voiced understanding and appreciation.

## 2022-08-29 NOTE — Telephone Encounter (Signed)
Pt called back to confirm that she didn't recall being placed on any other HRTs. Just on BCPs prior to climara patch and progesterone. Confirmed info and submitted to insurance.

## 2022-08-30 ENCOUNTER — Telehealth: Payer: Self-pay

## 2022-08-30 NOTE — Telephone Encounter (Signed)
Prior authorization was denied for combipatch 50/140 patch. I left patient a voicemail letting her know & to call us back so we could discuss this.  Her insurance states that formulary alteratives are climara patch pro, estradiol-norethindrone acetate tablet, norethindrone acetate ethinyl estradiol tablet, Jinteli & Mimvey. They state that these may also require prior authorization. Will send to provider for review once patient has called back.

## 2022-09-04 NOTE — Telephone Encounter (Signed)
Spoke with patient & she said she would like to try one of the alternatives that her insurance company suggested as long as you feel it would be good for her. She states she would prefer to be on one that has minimal side effects & does not increase bloating. Patient aware that per her insurance company that another prior authorization may need to be done for this. Please send in which hrt you feel would be good for her & I will notify her.   Pharmacy: [facility], [place]

## 2022-09-05 MED ORDER — ESTRADIOL 0.05 MG/24HR TD PTWK: 0.0500 mg | MEDICATED_PATCH | TRANSDERMAL | 8 refills | Status: DC

## 2022-09-05 MED ORDER — PROGESTERONE MICRONIZED 100 MG PO CAPS: 100.0000 mg | ORAL_CAPSULE | Freq: Every day | ORAL | 8 refills | Status: DC

## 2022-09-05 NOTE — Telephone Encounter (Signed)
Genia Del, MD  You1 hour ago (8:24 AM)    Please recommend Estradiol patch (Climara) 0.05 weekly with Prometrium 100 mg PO HS daily. Dr. Elbert Ewings        Patient states she is okay with the climara patch 0.05 & prometrium 100mg . Rx's sent to pharmacy.

## 2022-09-12 DIAGNOSIS — S39012D Strain of muscle, fascia and tendon of lower back, subsequent encounter: Secondary | ICD-10-CM | POA: Diagnosis not present

## 2022-09-14 DIAGNOSIS — M25612 Stiffness of left shoulder, not elsewhere classified: Secondary | ICD-10-CM | POA: Diagnosis not present

## 2022-09-14 DIAGNOSIS — M6281 Muscle weakness (generalized): Secondary | ICD-10-CM | POA: Diagnosis not present

## 2022-09-14 DIAGNOSIS — S42202D Unspecified fracture of upper end of left humerus, subsequent encounter for fracture with routine healing: Secondary | ICD-10-CM | POA: Diagnosis not present

## 2022-09-21 DIAGNOSIS — S42202D Unspecified fracture of upper end of left humerus, subsequent encounter for fracture with routine healing: Secondary | ICD-10-CM | POA: Diagnosis not present

## 2022-09-21 DIAGNOSIS — M6281 Muscle weakness (generalized): Secondary | ICD-10-CM | POA: Diagnosis not present

## 2022-09-21 DIAGNOSIS — M25612 Stiffness of left shoulder, not elsewhere classified: Secondary | ICD-10-CM | POA: Diagnosis not present

## 2022-09-28 DIAGNOSIS — M6281 Muscle weakness (generalized): Secondary | ICD-10-CM | POA: Diagnosis not present

## 2022-09-28 DIAGNOSIS — M25612 Stiffness of left shoulder, not elsewhere classified: Secondary | ICD-10-CM | POA: Diagnosis not present

## 2022-09-28 DIAGNOSIS — S42202D Unspecified fracture of upper end of left humerus, subsequent encounter for fracture with routine healing: Secondary | ICD-10-CM | POA: Diagnosis not present

## 2022-10-03 DIAGNOSIS — S39012D Strain of muscle, fascia and tendon of lower back, subsequent encounter: Secondary | ICD-10-CM | POA: Diagnosis not present

## 2022-10-05 ENCOUNTER — Other Ambulatory Visit: Payer: Self-pay | Admitting: Nurse Practitioner

## 2022-10-05 DIAGNOSIS — E785 Hyperlipidemia, unspecified: Secondary | ICD-10-CM

## 2022-10-05 DIAGNOSIS — M25612 Stiffness of left shoulder, not elsewhere classified: Secondary | ICD-10-CM | POA: Diagnosis not present

## 2022-10-05 DIAGNOSIS — S42202D Unspecified fracture of upper end of left humerus, subsequent encounter for fracture with routine healing: Secondary | ICD-10-CM | POA: Diagnosis not present

## 2022-10-05 DIAGNOSIS — M6281 Muscle weakness (generalized): Secondary | ICD-10-CM | POA: Diagnosis not present

## 2022-10-08 DIAGNOSIS — S39012D Strain of muscle, fascia and tendon of lower back, subsequent encounter: Secondary | ICD-10-CM | POA: Diagnosis not present

## 2022-10-10 DIAGNOSIS — S39012D Strain of muscle, fascia and tendon of lower back, subsequent encounter: Secondary | ICD-10-CM | POA: Diagnosis not present

## 2022-10-12 DIAGNOSIS — S42202D Unspecified fracture of upper end of left humerus, subsequent encounter for fracture with routine healing: Secondary | ICD-10-CM | POA: Diagnosis not present

## 2022-10-12 DIAGNOSIS — M6281 Muscle weakness (generalized): Secondary | ICD-10-CM | POA: Diagnosis not present

## 2022-10-12 DIAGNOSIS — M25612 Stiffness of left shoulder, not elsewhere classified: Secondary | ICD-10-CM | POA: Diagnosis not present

## 2022-10-17 DIAGNOSIS — S39012D Strain of muscle, fascia and tendon of lower back, subsequent encounter: Secondary | ICD-10-CM | POA: Diagnosis not present

## 2022-10-19 ENCOUNTER — Other Ambulatory Visit: Payer: Self-pay | Admitting: Nurse Practitioner

## 2022-10-19 DIAGNOSIS — S42202D Unspecified fracture of upper end of left humerus, subsequent encounter for fracture with routine healing: Secondary | ICD-10-CM | POA: Diagnosis not present

## 2022-10-19 DIAGNOSIS — M6281 Muscle weakness (generalized): Secondary | ICD-10-CM | POA: Diagnosis not present

## 2022-10-19 DIAGNOSIS — Z9109 Other allergy status, other than to drugs and biological substances: Secondary | ICD-10-CM

## 2022-10-19 DIAGNOSIS — M25612 Stiffness of left shoulder, not elsewhere classified: Secondary | ICD-10-CM | POA: Diagnosis not present

## 2022-11-02 DIAGNOSIS — S42202D Unspecified fracture of upper end of left humerus, subsequent encounter for fracture with routine healing: Secondary | ICD-10-CM | POA: Diagnosis not present

## 2022-11-02 DIAGNOSIS — M6281 Muscle weakness (generalized): Secondary | ICD-10-CM | POA: Diagnosis not present

## 2022-11-02 DIAGNOSIS — M25612 Stiffness of left shoulder, not elsewhere classified: Secondary | ICD-10-CM | POA: Diagnosis not present

## 2022-11-07 ENCOUNTER — Ambulatory Visit: Payer: 59 | Admitting: Nurse Practitioner

## 2022-11-09 DIAGNOSIS — M6281 Muscle weakness (generalized): Secondary | ICD-10-CM | POA: Diagnosis not present

## 2022-11-09 DIAGNOSIS — M25612 Stiffness of left shoulder, not elsewhere classified: Secondary | ICD-10-CM | POA: Diagnosis not present

## 2022-11-09 DIAGNOSIS — S42202D Unspecified fracture of upper end of left humerus, subsequent encounter for fracture with routine healing: Secondary | ICD-10-CM | POA: Diagnosis not present

## 2022-11-14 DIAGNOSIS — S42202D Unspecified fracture of upper end of left humerus, subsequent encounter for fracture with routine healing: Secondary | ICD-10-CM | POA: Diagnosis not present

## 2022-11-20 ENCOUNTER — Ambulatory Visit: Payer: 59 | Admitting: Nurse Practitioner

## 2022-11-23 DIAGNOSIS — M25612 Stiffness of left shoulder, not elsewhere classified: Secondary | ICD-10-CM | POA: Diagnosis not present

## 2022-11-23 DIAGNOSIS — M6281 Muscle weakness (generalized): Secondary | ICD-10-CM | POA: Diagnosis not present

## 2022-11-23 DIAGNOSIS — S42202D Unspecified fracture of upper end of left humerus, subsequent encounter for fracture with routine healing: Secondary | ICD-10-CM | POA: Diagnosis not present

## 2022-11-27 ENCOUNTER — Other Ambulatory Visit: Payer: Self-pay | Admitting: Nurse Practitioner

## 2022-11-27 DIAGNOSIS — E785 Hyperlipidemia, unspecified: Secondary | ICD-10-CM

## 2022-11-30 DIAGNOSIS — S42202D Unspecified fracture of upper end of left humerus, subsequent encounter for fracture with routine healing: Secondary | ICD-10-CM | POA: Diagnosis not present

## 2022-11-30 DIAGNOSIS — M6281 Muscle weakness (generalized): Secondary | ICD-10-CM | POA: Diagnosis not present

## 2022-11-30 DIAGNOSIS — M25612 Stiffness of left shoulder, not elsewhere classified: Secondary | ICD-10-CM | POA: Diagnosis not present

## 2022-12-21 DIAGNOSIS — S42202D Unspecified fracture of upper end of left humerus, subsequent encounter for fracture with routine healing: Secondary | ICD-10-CM | POA: Diagnosis not present

## 2022-12-21 DIAGNOSIS — M25612 Stiffness of left shoulder, not elsewhere classified: Secondary | ICD-10-CM | POA: Diagnosis not present

## 2022-12-21 DIAGNOSIS — M6281 Muscle weakness (generalized): Secondary | ICD-10-CM | POA: Diagnosis not present

## 2023-01-09 DIAGNOSIS — S42202D Unspecified fracture of upper end of left humerus, subsequent encounter for fracture with routine healing: Secondary | ICD-10-CM | POA: Diagnosis not present

## 2023-01-11 ENCOUNTER — Other Ambulatory Visit: Payer: Self-pay | Admitting: Nurse Practitioner

## 2023-01-11 DIAGNOSIS — L709 Acne, unspecified: Secondary | ICD-10-CM

## 2023-02-04 ENCOUNTER — Encounter: Payer: Self-pay | Admitting: Nurse Practitioner

## 2023-02-04 ENCOUNTER — Ambulatory Visit (INDEPENDENT_AMBULATORY_CARE_PROVIDER_SITE_OTHER): Payer: 59 | Admitting: Nurse Practitioner

## 2023-02-04 VITALS — BP 118/70 | HR 101 | Temp 97.4°F | Resp 16 | Ht 65.0 in | Wt 151.8 lb

## 2023-02-04 DIAGNOSIS — G8929 Other chronic pain: Secondary | ICD-10-CM

## 2023-02-04 DIAGNOSIS — E663 Overweight: Secondary | ICD-10-CM

## 2023-02-04 DIAGNOSIS — D219 Benign neoplasm of connective and other soft tissue, unspecified: Secondary | ICD-10-CM | POA: Diagnosis not present

## 2023-02-04 DIAGNOSIS — Z780 Asymptomatic menopausal state: Secondary | ICD-10-CM

## 2023-02-04 DIAGNOSIS — Z79899 Other long term (current) drug therapy: Secondary | ICD-10-CM

## 2023-02-04 DIAGNOSIS — E559 Vitamin D deficiency, unspecified: Secondary | ICD-10-CM | POA: Diagnosis not present

## 2023-02-04 DIAGNOSIS — Z9109 Other allergy status, other than to drugs and biological substances: Secondary | ICD-10-CM

## 2023-02-04 DIAGNOSIS — Z9889 Other specified postprocedural states: Secondary | ICD-10-CM

## 2023-02-04 DIAGNOSIS — E785 Hyperlipidemia, unspecified: Secondary | ICD-10-CM

## 2023-02-04 DIAGNOSIS — F324 Major depressive disorder, single episode, in partial remission: Secondary | ICD-10-CM | POA: Diagnosis not present

## 2023-02-04 DIAGNOSIS — F439 Reaction to severe stress, unspecified: Secondary | ICD-10-CM

## 2023-02-04 DIAGNOSIS — R300 Dysuria: Secondary | ICD-10-CM | POA: Diagnosis not present

## 2023-02-04 DIAGNOSIS — Z91018 Allergy to other foods: Secondary | ICD-10-CM | POA: Diagnosis not present

## 2023-02-04 DIAGNOSIS — Z8781 Personal history of (healed) traumatic fracture: Secondary | ICD-10-CM

## 2023-02-04 DIAGNOSIS — L709 Acne, unspecified: Secondary | ICD-10-CM

## 2023-02-04 DIAGNOSIS — M545 Low back pain, unspecified: Secondary | ICD-10-CM

## 2023-02-04 DIAGNOSIS — M8588 Other specified disorders of bone density and structure, other site: Secondary | ICD-10-CM | POA: Diagnosis not present

## 2023-02-04 MED ORDER — PHENTERMINE HCL 37.5 MG PO TABS
ORAL_TABLET | ORAL | 0 refills | Status: DC
Start: 2023-02-04 — End: 2023-04-08

## 2023-02-04 NOTE — Patient Instructions (Signed)
Phentermine Capsules or Tablets What is this medication? PHENTERMINE (FEN ter meen) promotes weight loss. It works by decreasing appetite. It is often used for a short period of time. Changes to diet and exercise are often combined with this medication. This medicine may be used for other purposes; ask your health care provider or pharmacist if you have questions. COMMON BRAND NAME(S): Adipex-P, Atti-Plex P, Atti-Plex P Spansule, Fastin, Lomaira, Pro-Fast, Pro-Fast HS, Pro-Fast SA, Tara-8 What should I tell my care team before I take this medication? They need to know if you have any of these conditions: Agitation or nervousness Diabetes Glaucoma Heart disease High blood pressure History of substance use disorder History of stroke Kidney disease Lung disease called Primary Pulmonary Hypertension (PPH) Taken an MAOI, such as Carbex, Eldepryl, Marplan, Nardil, or Parnate in last 14 days Taking stimulant medications for attention disorders, weight loss, or to stay awake Thyroid disease An unusual or allergic reaction to phentermine, other medications, foods, dyes, or preservatives Pregnant or trying to get pregnant Breastfeeding How should I use this medication? Take this medication by mouth with a glass of water. Follow the directions on the prescription label. Take your medication at regular intervals. Do not take it more often than directed. Do not stop taking except on your care team's advice. Talk to your care team about the use of this medication in children. While this medication may be prescribed for children 17 years or older for selected conditions, precautions do apply. Overdosage: If you think you have taken too much of this medicine contact a poison control center or emergency room at once. NOTE: This medicine is only for you. Do not share this medicine with others. What if I miss a dose? If you miss a dose, take it as soon as you can. If it is almost time for your next dose,  take only that dose. Do not take double or extra doses. What may interact with this medication? Do not take this medication with any of the following: MAOIs, such as Carbex, Eldepryl, Marplan, Nardil, and Parnate This medication may also interact with the following: Alcohol Certain medications for depression, anxiety, or other mental health conditions Certain medications for blood pressure Linezolid Medications for colds or breathing difficulties, such as pseudoephedrine or phenylephrine Medications for diabetes Sibutramine Stimulant medications for ADHD, weight loss, or staying awake This list may not describe all possible interactions. Give your health care provider a list of all the medicines, herbs, non-prescription drugs, or dietary supplements you use. Also tell them if you smoke, drink alcohol, or use illegal drugs. Some items may interact with your medicine. What should I watch for while using this medication? Visit your care team for regular checks on your progress. Do not stop taking except on your care team's advice. You may develop a severe reaction. Your care team will tell you how much medication to take. Do not take this medication close to bedtime. It may prevent you from sleeping. This medication may affect your coordination, reaction time, or judgment. Do not drive or operate machinery until you know how this medication affects you. Sit up or stand slowly to reduce the risk of dizzy or fainting spells. Drinking alcohol with this medication can increase the risk of these side effects. This medication may affect blood sugar levels. Ask your care team if changes in diet or medications are needed if you have diabetes. Inform your care team if you wish to become pregnant or think you might be pregnant. Losing  weight while pregnant is not advised and may cause harm to the unborn child. Talk to your care team for more information. What side effects may I notice from receiving this  medication? Side effects that you should report to your care team as soon as possible: Allergic reactions--skin rash, itching, hives, swelling of the face, lips, tongue, or throat Heart valve disease--shortness of breath, chest pain, unusual weakness or fatigue, dizziness, feeling faint or lightheaded, fever, sudden weight gain, fast or irregular heartbeat Pulmonary hypertension--shortness of breath, chest pain, fast or irregular heartbeat, feeling faint or lightheaded, fatigue, swelling of the ankles or feet Side effects that usually do not require medical attention (report to your care team if they continue or are bothersome): Change in taste Diarrhea Dizziness Dry mouth Restlessness Trouble sleeping This list may not describe all possible side effects. Call your doctor for medical advice about side effects. You may report side effects to FDA at 1-800-FDA-1088. Where should I keep my medication? Keep out of the reach of children. This medication can be abused. Keep your medication in a safe place to protect it from theft. Do not share this medication with anyone. Selling or giving away this medication is dangerous and against the law. This medication may cause harm and death if it is taken by other adults, children, or pets. Return medication that has not been used to an official disposal site. Contact the DEA at 267-150-6363 or your city/county government to find a site. If you cannot return the medication, mix any unused medication with a substance like cat litter or coffee grounds. Then throw the medication away in a sealed container like a sealed bag or coffee can with a lid. Do not use the medication after the expiration date. Store at room temperature between 20 and 25 degrees C (68 and 77 degrees F). Keep container tightly closed. NOTE: This sheet is a summary. It may not cover all possible information. If you have questions about this medicine, talk to your doctor, pharmacist, or health  care provider.  2024 Elsevier/Gold Standard (2021-10-18 00:00:00)

## 2023-02-04 NOTE — Progress Notes (Signed)
6 Month Follow Up  Assessment and Plan:   Hyperlipidemia, unspecified hyperlipidemia type Continue Fenofibrate Discussed lifestyle modifications. Recommended diet heavy in fruits and veggies, omega 3's. Decrease consumption of animal meats, cheeses, and dairy products. Remain active and exercise as tolerated. Continue to monitor. Check lipids/TSH  Vitamin D deficiency Continue supplement Monitor levels  Environmental allergies Avoid triggers Continue OTC antihistamines PRN  Acne, unspecified acne type On spirolactone Derm following Monitor K+  Chronic right-sided low back pain without sciatica Can follow up with PT/strength and do proper lifting techniques  Fibroid GYN following  Food allergy - gluten Avoid gluten, celiac panel was negative Due for colonoscopy - defer at this time until shoulder repair completed.    Medication management All medications discussed and reviewed in full. All questions and concerns regarding medications addressed.    Osteopenia Pursue a combination of weight-bearing exercises and strength training. Advised on fall prevention measures including proper lighting in all rooms, removal of area rugs and floor clutter, use of walking devices as deemed appropriate, avoidance of uneven walking surfaces. Smoking cessation and moderate alcohol consumption if applicable Consume 800 to 1000 IU of vitamin D daily with a goal vitamin D serum value of 30 ng/mL or higher. Aim for 1000 to 1200 mg of elemental calcium daily through supplements and/or dietary sources. Due - to discuss with GYN Check magnesium and Vitamin D levels  Stress and adjustment reaction/ depression Stress management techniques discussed, increase water, good sleep hygiene discussed, increase exercise, and increase veggies.   Post menopause symptoms Combipatch d/c - continue to follow with GYN  S/P ORIF Ortho following Continue alternative treatments  Dysuria Check  UA/Culture  Stay well hydrated to keep urinary system well flushed Consider daily cranberry juice or oral supplement to help any bacteria from adhering to bladder wall causing increase for infection. Monitor for any increase in fever, chills, N/V, abdominal pain, hematuria.    Medication management All medications discussed and reviewed in full. All questions and concerns regarding medications addressed.    Overweight Start Phentermine  Discussed appropriate BMI Diet modification. Physical activity. Encouraged/praised to build confidence.   Orders Placed This Encounter  Procedures   Urine Culture   CBC with Differential/Platelet   COMPLETE METABOLIC PANEL WITH GFR   Lipid panel   VITAMIN D 25 Hydroxy (Vit-D Deficiency, Fractures)   Magnesium   Urinalysis, Routine w reflex microscopic   Meds ordered this encounter  Medications   phentermine (ADIPEX-P) 37.5 MG tablet    Sig: Take 1/2 to 1 tablet every Morning for Dieting & Weight Loss    Dispense:  30 tablet    Refill:  0    Order Specific Question:   Supervising Provider    Answer:   Lucky Cowboy [phone]   Notify office for further evaluation and treatment, questions or concerns if any reported s/s fail to improve.   The patient was advised to call back or seek an in-person evaluation if any symptoms worsen or if the condition fails to improve as anticipated.   Further disposition pending results of labs. Discussed med's effects and SE's.    I discussed the assessment and treatment plan with the patient. The patient was provided an opportunity to ask questions and all were answered. The patient agreed with the plan and demonstrated an understanding of the instructions.  Discussed med's effects and SE's. Screening labs and tests as requested with regular follow-up as recommended.  I provided 30 minutes of face-to-face time during this encounter  including counseling, chart review, and critical decision making was  preformed.    Future Appointments  Date Time Provider Department Center  05/02/2023  9:00 AM Adela Glimpse, NP [facility] None     HPI  57 y.o. female  presents for a general follow up. She has Environmental allergies; Hyperlipidemia; Vitamin D deficiency; Acne; Food allergy; Low back pain; Diverticulosis; Gluten intolerance; Diarrhea; Post-menopausal; Osteopenia of lumbar spine; Major depression in partial remission (HCC); Grief reaction; and Recurrent candidiasis of vagina on their problem list.  She is single, never married/no children. Not currently dating. She is currently studying interior Press photographer, has degree in interior architecture.  Working in [place], feels travel time is too much.  Looking to relocate/move but cannot at this time d/t most recent left shoulder ORIF.  Saw [facility] 02/05/22 for left humerus open reduction and internal fixation surgery 12/12/2021.  She was transitioned out of the sling. Has completed PT.  Working on regaining strength, has good days and bad days with increase in ROM and pain.  Followed up with [facility] on 03/07/22.  Reports improvement in external rotation.  She was started on Meloxicam and Tramadol.  She is no longer taking Tramadol.  She has chronic lower back pain and has seen Dr. Ethelene Hal in the past.   She has met with a energy therapist and is looking into starting acupuncture.  She will also be meeting with a nutritionalist to aide ina more holistic approach to recovery.  She has met once with the energy therapist and feels very good about moving forward.    Feels as though menopause is contributing to weight gain and overall fatigue, disorganization.  She was on combipatch but insurance will no longer cover.  Tried estradiol which made her feel bloated.  She is no longer taking hormone replacement.  She continues to follow with GYN.   She has had a rough year emotionally, mother recently passed 2023.  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 She has done some therapy/grief counseling, currently managing with meditation, prayers. She has taken Zoloft in the past but not currently on any medication for mood.  Feels as though overall, stable.   She is on spirolactone for her acne and follows with Dr. Nicholas Lose, requests potassium check annually here.   In 2021 she had abdominal pain followed by persistent diarrhea for 6 months, rapid covid 19 and GI pathogen panel was Negative. CRP 28.2 and ESR 43 were both elevated. She had negative celiac panel. She was referred to [facility] but colonoscopy/EGD was going to cost $1000 for facility charge fee. She reports sx abruptly resolved after about 6 months and declined further workup. She had colonoscopy completed 09/2014 with a 5 year recall.  Overdue since 2021.  She is planning to update once shoulder is more stable.  Reports her sister informed her of multiple family members now with duodenal cancer.    BMI is Body mass index is 25.26 kg/m., she has been working on diet and exercise. Wt Readings from Last 3 Encounters:  02/04/23 151 lb 12.8 oz (68.9 kg)  05/01/22 153 lb 3.2 oz (69.5 kg)  04/18/22 149 lb (67.6 kg)   Her blood pressure has been controlled at home, today their BP is BP: 118/70 She does not workout due to lower back pain.  She denies chest pain, shortness of breath, dizziness.   She is on cholesterol medication (fenofibrate) and denies myalgias. Her cholesterol  is at goal. The cholesterol last visit was:   Lab Results  Component Value Date   CHOL 190 05/01/2022   HDL 55 05/01/2022   LDLCALC 115 (H) 05/01/2022   TRIG 95 05/01/2022   CHOLHDL 3.5 05/01/2022    Patient is not on Vitamin D supplement, 5000 IU daily.  Lab Results  Component Value Date   VD25OH 53 05/01/2022      Current Medications:  Current Outpatient Medications on File Prior to Visit  Medication Sig   Calcium Carbonate (CALCIUM 500 PO)  Take by mouth daily.   Cholecalciferol (VITAMIN D PO) Take 5,000 Units by mouth.   Cyanocobalamin (VITAMIN B-12 PO) Take by mouth. (Patient not taking: Reported on 05/01/2022)   cyclobenzaprine (FLEXERIL) 5 MG tablet Take 1 tab (5 mg) daily at bedtime.   EPINEPHrine 0.3 mg/0.3 mL IJ SOAJ injection Inject 0.3 mLs (0.3 mg total) into the muscle as needed for anaphylaxis.   estradiol (CLIMARA - DOSED IN MG/24 HR) 0.05 mg/24hr patch Place 1 patch (0.05 mg total) onto the skin once a week.   fenofibrate (TRICOR) 145 MG tablet TAKE 1 TABLET BY MOUTH EVERY DAY AS DIRECTED BY PRESCRIBER   levocetirizine (XYZAL) 5 MG tablet TAKE 1 TAB BY MOUTH DAILY FOR ALLERGIES   meloxicam (MOBIC) 15 MG tablet Take by mouth.   Multiple Vitamin (THERA) TABS Take 1 tablet by mouth.   naproxen sodium (ALEVE) 220 MG tablet Take by mouth.   progesterone (PROMETRIUM) 100 MG capsule Take 1 capsule (100 mg total) by mouth daily.   spironolactone (ALDACTONE) 100 MG tablet TAKE 1 AND A 1/2 TAB BY MOUTH DAILY   tazarotene (AVAGE) 0.1 % cream Apply topically at bedtime.   traMADol (ULTRAM) 50 MG tablet Take 50 mg by mouth every 6 (six) hours as needed. (Patient not taking: Reported on 04/18/2022)   No current facility-administered medications on file prior to visit.   Immunization History  Administered Date(s) Administered   Influenza Split 01/19/2015   Influenza, Seasonal, Injecte, Preservative Fre 05/02/2016   Influenza,inj,Quad PF,6+ Mos 03/08/2021   Influenza-Unspecified 12/22/2017   PFIZER(Purple Top)SARS-COV-2 Vaccination 07/04/2019, 07/25/2019, 12/17/2019   Tdap 05/14/2017    Patient Care Team: Lucky Cowboy, MD as PCP - General (Internal Medicine) Audie Box, Nadyne Coombes, MD (Inactive) as Consulting Physician (Gynecology) Sheran Luz, MD as Consulting Physician (Physical Medicine and Rehabilitation) Venancio Poisson, MD as Consulting Physician (Dermatology) Bobbitt, Heywood Iles, MD as Consulting Physician (Allergy  and Immunology) Hilarie Fredrickson, MD as Consulting Physician (Gastroenterology)  Medical History:  Past Medical History:  Diagnosis Date   Allergy    Diverticulosis 12/30/2019   Environmental allergies    Fibroid    Hyperlipidemia    Osteopenia 07/2017   Score -1.5 FRAX 4.6% / 0.2%   Uterine fibroid    Allergies Allergies  Allergen Reactions   Molds & Smuts Rash    Pt was tested at allergist.    Gluten Meal     Has been tested and received a "false negative"   [place] [Fish Allergy] Itching    Hives; hypotension Also anchovies    SURGICAL HISTORY She  has a past surgical history that includes Appendectomy; Myomectomy; Hysteroscopy (04/23/2005); and arm surgery. FAMILY HISTORY Her family history includes Breast cancer in her mother; Cancer in her maternal grandmother, maternal uncle, mother, and another family member; Heart disease in her maternal grandmother; Hyperlipidemia in her maternal grandmother; Hypertension in her maternal grandmother; Lymphoma in her paternal aunt; Thyroid disease in her maternal  grandmother. SOCIAL HISTORY She  reports that she has never smoked. She has never used smokeless tobacco. She reports that she does not currently use alcohol. She reports that she does not use drugs.  Review of Systems: Review of Systems  Constitutional:  Negative for malaise/fatigue and weight loss.  HENT:  Negative for hearing loss and tinnitus.   Eyes:  Negative for blurred vision and double vision.  Respiratory:  Negative for cough, sputum production, shortness of breath and wheezing.   Cardiovascular:  Negative for chest pain, palpitations, orthopnea, claudication, leg swelling and PND.  Gastrointestinal:  Negative for abdominal pain, blood in stool, constipation, diarrhea, heartburn, melena, nausea and vomiting.  Genitourinary: Negative.        Vaginal odor without itching or discharge  Musculoskeletal:  Negative for falls, joint pain and myalgias.  Skin:  Negative for  rash.  Neurological:  Negative for dizziness, tingling, sensory change, weakness and headaches.  Endo/Heme/Allergies:  Negative for polydipsia.  Psychiatric/Behavioral:  Positive for depression. Negative for memory loss, substance abuse and suicidal ideas. The patient is not nervous/anxious and does not have insomnia.   All other systems reviewed and are negative.   Physical Exam: Estimated body mass index is 25.26 kg/m as calculated from the following:   Height as of this encounter: 5\' 5"  (1.651 m).   Weight as of this encounter: 151 lb 12.8 oz (68.9 kg). BP 118/70   Pulse (!) 101   Temp (!) 97.4 F (36.3 C)   Resp 16   Ht 5\' 5"  (1.651 m)   Wt 151 lb 12.8 oz (68.9 kg)   LMP 12/20/2015   SpO2 99%   BMI 25.26 kg/m  General Appearance: Well nourished, in no apparent distress.  Eyes: PERRLA, EOMs, conjunctiva no swelling or erythema, normal fundi and vessels.  Sinuses: No Frontal/maxillary tenderness  ENT/Mouth: Ext aud canals clear, normal light reflex with TMs without erythema, bulging. Good dentition. No erythema, swelling, or exudate on post pharynx. Tonsils not swollen or erythematous. Hearing normal.  Neck: Supple, thyroid normal. No bruits  Respiratory: Respiratory effort normal, BS equal bilaterally without rales, rhonchi, wheezing or stridor.  Cardio: RRR without murmurs, rubs or gallops. Brisk peripheral pulses without edema.  Chest: symmetric, with normal excursions and percussion.  Breasts: defer  Abdomen: Soft, nontender, no guarding, rebound, hernias, masses, or organomegaly.  Lymphatics: Non tender without lymphadenopathy.  Genitourinary: defer Musculoskeletal: Full ROM all peripheral extremities,5/5 strength, and normal gait.  Skin: Warm, dry without rashes, lesions, ecchymosis. Neuro: Cranial nerves intact, reflexes equal bilaterally. Normal muscle tone, no cerebellar symptoms. Sensation intact.  Psych: Awake and oriented X 3, depressed affect, Insight and Judgment  appropriate.   Jakelyn Squyres 4:26 PM [facility]

## 2023-02-05 LAB — CBC WITH DIFFERENTIAL/PLATELET
Absolute Monocytes: 593 {cells}/uL (ref 200–950)
Basophils Absolute: 60 {cells}/uL (ref 0–200)
Basophils Relative: 0.7 %
Eosinophils Absolute: 344 {cells}/uL (ref 15–500)
Eosinophils Relative: 4 %
HCT: 40.3 % (ref 35.0–45.0)
Hemoglobin: 13.4 g/dL (ref 11.7–15.5)
Lymphs Abs: 2124 {cells}/uL (ref 850–3900)
MCH: 31.2 pg (ref 27.0–33.0)
MCHC: 33.3 g/dL (ref 32.0–36.0)
MCV: 93.7 fL (ref 80.0–100.0)
MPV: 10.4 fL (ref 7.5–12.5)
Monocytes Relative: 6.9 %
Neutro Abs: 5478 {cells}/uL (ref 1500–7800)
Neutrophils Relative %: 63.7 %
Platelets: 457 10*3/uL — ABNORMAL HIGH (ref 140–400)
RBC: 4.3 10*6/uL (ref 3.80–5.10)
RDW: 11.7 % (ref 11.0–15.0)
Total Lymphocyte: 24.7 %
WBC: 8.6 10*3/uL (ref 3.8–10.8)

## 2023-02-05 LAB — URINALYSIS, ROUTINE W REFLEX MICROSCOPIC
Bilirubin Urine: NEGATIVE
Glucose, UA: NEGATIVE
Hgb urine dipstick: NEGATIVE
Ketones, ur: NEGATIVE
Leukocytes,Ua: NEGATIVE
Nitrite: NEGATIVE
Protein, ur: NEGATIVE
Specific Gravity, Urine: 1.014 (ref 1.001–1.035)
pH: 6.5 (ref 5.0–8.0)

## 2023-02-05 LAB — LIPID PANEL
Cholesterol: 226 mg/dL — ABNORMAL HIGH (ref <200)
HDL: 59 mg/dL (ref >=50)
LDL Cholesterol (Calc): 138 mg/dL — ABNORMAL HIGH
Non-HDL Cholesterol (Calc): 167 mg/dL — ABNORMAL HIGH (ref <130)
Total CHOL/HDL Ratio: 3.8 (calc) (ref <5.0)
Triglycerides: 156 mg/dL — ABNORMAL HIGH (ref <150)

## 2023-02-05 LAB — COMPLETE METABOLIC PANEL WITH GFR
AG Ratio: 1.6 (calc) (ref 1.0–2.5)
ALT: 17 U/L (ref 6–29)
AST: 19 U/L (ref 10–35)
Albumin: 4.7 g/dL (ref 3.6–5.1)
Alkaline phosphatase (APISO): 59 U/L (ref 37–153)
BUN: 18 mg/dL (ref 7–25)
CO2: 30 mmol/L (ref 20–32)
Calcium: 10.9 mg/dL — ABNORMAL HIGH (ref 8.6–10.4)
Chloride: 105 mmol/L (ref 98–110)
Creat: 0.87 mg/dL (ref 0.50–1.03)
Globulin: 3 g/dL (ref 1.9–3.7)
Glucose, Bld: 95 mg/dL (ref 65–99)
Potassium: 4.9 mmol/L (ref 3.5–5.3)
Sodium: 142 mmol/L (ref 135–146)
Total Bilirubin: 0.3 mg/dL (ref 0.2–1.2)
Total Protein: 7.7 g/dL (ref 6.1–8.1)
eGFR: 77 mL/min/{1.73_m2} (ref >=60)

## 2023-02-05 LAB — URINE CULTURE
MICRO NUMBER:: 15592632
Result:: NO GROWTH
SPECIMEN QUALITY:: ADEQUATE

## 2023-02-05 LAB — VITAMIN D 25 HYDROXY (VIT D DEFICIENCY, FRACTURES): Vit D, 25-Hydroxy: 58 ng/mL (ref 30–100)

## 2023-02-05 LAB — MAGNESIUM: Magnesium: 2.1 mg/dL (ref 1.5–2.5)

## 2023-02-06 DIAGNOSIS — L814 Other melanin hyperpigmentation: Secondary | ICD-10-CM | POA: Diagnosis not present

## 2023-02-06 DIAGNOSIS — Z1231 Encounter for screening mammogram for malignant neoplasm of breast: Secondary | ICD-10-CM | POA: Diagnosis not present

## 2023-02-06 DIAGNOSIS — L738 Other specified follicular disorders: Secondary | ICD-10-CM | POA: Diagnosis not present

## 2023-02-06 DIAGNOSIS — Z419 Encounter for procedure for purposes other than remedying health state, unspecified: Secondary | ICD-10-CM | POA: Diagnosis not present

## 2023-02-06 DIAGNOSIS — L700 Acne vulgaris: Secondary | ICD-10-CM | POA: Diagnosis not present

## 2023-02-06 LAB — HM MAMMOGRAPHY

## 2023-02-07 ENCOUNTER — Encounter: Payer: Self-pay | Admitting: Internal Medicine

## 2023-02-07 ENCOUNTER — Telehealth: Payer: Self-pay | Admitting: *Deleted

## 2023-02-07 NOTE — Telephone Encounter (Signed)
Spoke with patient. Patient stopped HRT approximately 3 wks ago, thought it may be too much. Was feeling fatigued. Now that she has been off of medication she reports not being able to sleep well, would like to discuss alternative HRT or lower dose.   Last AEX 04/18/22 -ML Last MMG 02/06/23 -patient will request copy of MMG report from [facility] to Dr. Kennith Center.   OV scheduled for consult on 10/24 at 1600.   Routing to provider for final review. Patient is agreeable to disposition. Will close encounter.

## 2023-02-08 ENCOUNTER — Encounter: Payer: Self-pay | Admitting: Obstetrics and Gynecology

## 2023-02-14 ENCOUNTER — Ambulatory Visit: Payer: Commercial Managed Care - HMO | Admitting: Obstetrics and Gynecology

## 2023-02-15 NOTE — Progress Notes (Signed)
Cancelled.  

## 2023-03-07 ENCOUNTER — Ambulatory Visit: Payer: Commercial Managed Care - HMO | Admitting: Obstetrics and Gynecology

## 2023-03-13 DIAGNOSIS — S42202D Unspecified fracture of upper end of left humerus, subsequent encounter for fracture with routine healing: Secondary | ICD-10-CM | POA: Diagnosis not present

## 2023-04-07 ENCOUNTER — Other Ambulatory Visit: Payer: Self-pay | Admitting: Nurse Practitioner

## 2023-04-07 DIAGNOSIS — E663 Overweight: Secondary | ICD-10-CM

## 2023-04-10 DIAGNOSIS — S42202D Unspecified fracture of upper end of left humerus, subsequent encounter for fracture with routine healing: Secondary | ICD-10-CM | POA: Diagnosis not present

## 2023-04-17 ENCOUNTER — Other Ambulatory Visit: Payer: Self-pay | Admitting: Nurse Practitioner

## 2023-04-17 DIAGNOSIS — Z9109 Other allergy status, other than to drugs and biological substances: Secondary | ICD-10-CM

## 2023-04-26 ENCOUNTER — Encounter: Payer: Self-pay | Admitting: Nurse Practitioner

## 2023-04-29 ENCOUNTER — Other Ambulatory Visit: Payer: Self-pay | Admitting: Nurse Practitioner

## 2023-04-29 DIAGNOSIS — E785 Hyperlipidemia, unspecified: Secondary | ICD-10-CM

## 2023-05-02 ENCOUNTER — Encounter: Payer: Self-pay | Admitting: Nurse Practitioner

## 2023-06-04 ENCOUNTER — Encounter: Payer: Self-pay | Admitting: *Deleted

## 2023-06-18 ENCOUNTER — Other Ambulatory Visit: Payer: Self-pay | Admitting: Obstetrics & Gynecology

## 2023-06-18 NOTE — Telephone Encounter (Signed)
 Medication refill request: climara 0.05mg  Last AEX:  04-18-22 Next AEX: not scheduled Last MMG (if hormonal medication request): 02-06-23 birads 1:neg  Patient stated 10/24 that she had stopped her HRT. Patient cancelled 2 appointments that she made to discuss her HRT & trying something different. Patient has not rescheduled. Please deny rx if appropriate.

## 2023-06-18 NOTE — Telephone Encounter (Signed)
 Needs appt

## 2023-06-27 ENCOUNTER — Encounter: Payer: Self-pay | Admitting: *Deleted

## 2023-08-07 ENCOUNTER — Ambulatory Visit: Payer: Self-pay | Admitting: Nurse Practitioner

## 2023-08-08 ENCOUNTER — Ambulatory Visit: Payer: Self-pay | Admitting: Nurse Practitioner

## 2024-05-01 ENCOUNTER — Encounter: Payer: Self-pay | Admitting: Nurse Practitioner
